# Patient Record
Sex: Female | Born: 1965
Health system: Southern US, Community
[De-identification: ages and names within clinical notes are randomized; demographics above are authoritative.]

## PROBLEM LIST (undated history)

## (undated) DIAGNOSIS — C50919 Malignant neoplasm of unspecified site of unspecified female breast: Secondary | ICD-10-CM

## (undated) DIAGNOSIS — Z9221 Personal history of antineoplastic chemotherapy: Secondary | ICD-10-CM

## (undated) DIAGNOSIS — N12 Tubulo-interstitial nephritis, not specified as acute or chronic: Secondary | ICD-10-CM

## (undated) DIAGNOSIS — G43909 Migraine, unspecified, not intractable, without status migrainosus: Secondary | ICD-10-CM

## (undated) DIAGNOSIS — Z923 Personal history of irradiation: Secondary | ICD-10-CM

## (undated) HISTORY — DX: Malignant neoplasm of unspecified site of unspecified female breast: C50.919

## (undated) HISTORY — DX: Tubulo-interstitial nephritis, not specified as acute or chronic: N12

---

## 2002-07-04 ENCOUNTER — Ambulatory Visit (HOSPITAL_COMMUNITY): Admission: RE | Admit: 2002-07-04 | Discharge: 2002-07-04 | Payer: Self-pay | Admitting: Internal Medicine

## 2002-07-04 HISTORY — PX: COLONOSCOPY: SHX174

## 2003-04-12 ENCOUNTER — Ambulatory Visit (HOSPITAL_COMMUNITY): Admission: RE | Admit: 2003-04-12 | Discharge: 2003-04-12 | Payer: Self-pay | Admitting: Family Medicine

## 2003-04-14 ENCOUNTER — Ambulatory Visit (HOSPITAL_COMMUNITY): Admission: RE | Admit: 2003-04-14 | Discharge: 2003-04-14 | Payer: Self-pay | Admitting: Family Medicine

## 2004-06-09 HISTORY — PX: CHOLECYSTECTOMY: SHX55

## 2004-11-08 ENCOUNTER — Ambulatory Visit (HOSPITAL_COMMUNITY): Admission: RE | Admit: 2004-11-08 | Discharge: 2004-11-08 | Payer: Self-pay | Admitting: Family Medicine

## 2004-11-18 ENCOUNTER — Ambulatory Visit (HOSPITAL_COMMUNITY): Admission: RE | Admit: 2004-11-18 | Discharge: 2004-11-18 | Payer: Self-pay | Admitting: Family Medicine

## 2004-11-29 ENCOUNTER — Ambulatory Visit (HOSPITAL_COMMUNITY): Admission: RE | Admit: 2004-11-29 | Discharge: 2004-11-29 | Payer: Self-pay | Admitting: General Surgery

## 2004-11-29 ENCOUNTER — Encounter (INDEPENDENT_AMBULATORY_CARE_PROVIDER_SITE_OTHER): Payer: Self-pay | Admitting: General Surgery

## 2005-03-27 ENCOUNTER — Ambulatory Visit: Payer: Self-pay | Admitting: Orthopedic Surgery

## 2005-05-08 ENCOUNTER — Ambulatory Visit: Payer: Self-pay | Admitting: Orthopedic Surgery

## 2005-05-13 ENCOUNTER — Encounter (HOSPITAL_COMMUNITY): Admission: RE | Admit: 2005-05-13 | Discharge: 2005-05-29 | Payer: Self-pay | Admitting: Orthopedic Surgery

## 2005-07-02 ENCOUNTER — Ambulatory Visit: Payer: Self-pay | Admitting: Orthopedic Surgery

## 2006-06-09 DIAGNOSIS — Z923 Personal history of irradiation: Secondary | ICD-10-CM

## 2006-06-09 DIAGNOSIS — Z9221 Personal history of antineoplastic chemotherapy: Secondary | ICD-10-CM

## 2006-06-09 DIAGNOSIS — C50919 Malignant neoplasm of unspecified site of unspecified female breast: Secondary | ICD-10-CM

## 2006-06-09 HISTORY — PX: BREAST SURGERY: SHX581

## 2006-06-09 HISTORY — PX: BREAST LUMPECTOMY: SHX2

## 2006-06-09 HISTORY — DX: Personal history of antineoplastic chemotherapy: Z92.21

## 2006-06-09 HISTORY — DX: Personal history of irradiation: Z92.3

## 2006-06-09 HISTORY — DX: Malignant neoplasm of unspecified site of unspecified female breast: C50.919

## 2007-03-04 ENCOUNTER — Encounter: Admission: RE | Admit: 2007-03-04 | Discharge: 2007-03-04 | Payer: Self-pay | Admitting: Family Medicine

## 2007-03-10 ENCOUNTER — Encounter: Admission: RE | Admit: 2007-03-10 | Discharge: 2007-03-10 | Payer: Self-pay | Admitting: Family Medicine

## 2007-03-15 ENCOUNTER — Encounter: Admission: RE | Admit: 2007-03-15 | Discharge: 2007-03-15 | Payer: Self-pay | Admitting: Family Medicine

## 2007-03-15 ENCOUNTER — Encounter (INDEPENDENT_AMBULATORY_CARE_PROVIDER_SITE_OTHER): Payer: Self-pay | Admitting: Diagnostic Radiology

## 2007-03-21 ENCOUNTER — Encounter: Admission: RE | Admit: 2007-03-21 | Discharge: 2007-03-21 | Payer: Self-pay | Admitting: Family Medicine

## 2007-03-25 ENCOUNTER — Ambulatory Visit: Payer: Self-pay | Admitting: Oncology

## 2007-04-01 ENCOUNTER — Encounter: Admission: RE | Admit: 2007-04-01 | Discharge: 2007-04-01 | Payer: Self-pay | Admitting: Surgery

## 2007-04-01 ENCOUNTER — Encounter (INDEPENDENT_AMBULATORY_CARE_PROVIDER_SITE_OTHER): Payer: Self-pay | Admitting: Diagnostic Radiology

## 2007-05-10 ENCOUNTER — Encounter: Admission: RE | Admit: 2007-05-10 | Discharge: 2007-05-10 | Payer: Self-pay | Admitting: Surgery

## 2007-05-14 ENCOUNTER — Encounter: Admission: RE | Admit: 2007-05-14 | Discharge: 2007-05-14 | Payer: Self-pay | Admitting: Surgery

## 2007-05-14 ENCOUNTER — Encounter (INDEPENDENT_AMBULATORY_CARE_PROVIDER_SITE_OTHER): Payer: Self-pay | Admitting: Surgery

## 2007-05-14 ENCOUNTER — Ambulatory Visit (HOSPITAL_BASED_OUTPATIENT_CLINIC_OR_DEPARTMENT_OTHER): Admission: RE | Admit: 2007-05-14 | Discharge: 2007-05-14 | Payer: Self-pay | Admitting: Surgery

## 2007-06-10 HISTORY — PX: OTHER SURGICAL HISTORY: SHX169

## 2007-07-07 ENCOUNTER — Ambulatory Visit (HOSPITAL_BASED_OUTPATIENT_CLINIC_OR_DEPARTMENT_OTHER): Admission: RE | Admit: 2007-07-07 | Discharge: 2007-07-07 | Payer: Self-pay | Admitting: Surgery

## 2007-07-07 ENCOUNTER — Encounter (INDEPENDENT_AMBULATORY_CARE_PROVIDER_SITE_OTHER): Payer: Self-pay | Admitting: Surgery

## 2007-12-24 ENCOUNTER — Ambulatory Visit: Admission: RE | Admit: 2007-12-24 | Discharge: 2008-02-24 | Payer: Self-pay | Admitting: Radiation Oncology

## 2008-02-15 ENCOUNTER — Ambulatory Visit: Payer: Self-pay | Admitting: Cardiology

## 2008-04-09 HISTORY — PX: BILATERAL SALPINGOOPHORECTOMY: SHX1223

## 2008-05-31 ENCOUNTER — Encounter: Admission: RE | Admit: 2008-05-31 | Discharge: 2008-05-31 | Payer: Self-pay | Admitting: Internal Medicine

## 2008-10-19 ENCOUNTER — Ambulatory Visit: Payer: Self-pay | Admitting: Cardiology

## 2008-11-29 ENCOUNTER — Ambulatory Visit (HOSPITAL_COMMUNITY): Admission: RE | Admit: 2008-11-29 | Discharge: 2008-11-29 | Payer: Self-pay | Admitting: Family Medicine

## 2009-03-09 ENCOUNTER — Encounter: Admission: RE | Admit: 2009-03-09 | Discharge: 2009-03-09 | Payer: Self-pay | Admitting: Internal Medicine

## 2009-03-20 ENCOUNTER — Ambulatory Visit: Payer: Self-pay | Admitting: Cardiology

## 2009-04-19 ENCOUNTER — Ambulatory Visit (HOSPITAL_BASED_OUTPATIENT_CLINIC_OR_DEPARTMENT_OTHER): Admission: RE | Admit: 2009-04-19 | Discharge: 2009-04-19 | Payer: Self-pay | Admitting: Surgery

## 2009-06-07 ENCOUNTER — Emergency Department (HOSPITAL_COMMUNITY): Admission: EM | Admit: 2009-06-07 | Discharge: 2009-06-07 | Payer: Self-pay | Admitting: Emergency Medicine

## 2009-09-26 ENCOUNTER — Ambulatory Visit: Payer: Self-pay | Admitting: Cardiology

## 2010-03-11 ENCOUNTER — Encounter: Admission: RE | Admit: 2010-03-11 | Discharge: 2010-03-11 | Payer: Self-pay | Admitting: Internal Medicine

## 2010-04-25 ENCOUNTER — Ambulatory Visit: Payer: Self-pay | Admitting: Cardiology

## 2010-06-30 ENCOUNTER — Encounter: Payer: Self-pay | Admitting: Family Medicine

## 2010-09-09 LAB — URINALYSIS, ROUTINE W REFLEX MICROSCOPIC
Bilirubin Urine: NEGATIVE
Glucose, UA: NEGATIVE mg/dL
Hgb urine dipstick: NEGATIVE
Ketones, ur: NEGATIVE mg/dL
Nitrite: NEGATIVE
pH: 8.5 — ABNORMAL HIGH (ref 5.0–8.0)

## 2010-10-22 NOTE — Op Note (Signed)
NAME:  Melissa Nguyen, Melissa Nguyen              ACCOUNT NO.:  [id number]   MEDICAL RECORD NO.:  [id number]          PATIENT TYPE:  AMB   LOCATION:  DSC                          FACILITY:  [facility]   PHYSICIAN:  Currie Paris, M.D.DATE OF BIRTH:  1965-07-24   DATE OF PROCEDURE:  05/14/2007  DATE OF DISCHARGE:                               OPERATIVE REPORT   OFFICE MEDICAL RECORD NUMBER [phone]   PREOPERATIVE DIAGNOSIS:  Ductal carcinoma in situ, right breast, upper  inner quadrant.   POSTOPERATIVE DIAGNOSIS:  Ductal carcinoma in situ, right breast, upper  inner quadrant.   OPERATION:  Needle localized right partial mastectomy.   SURGEON:  Currie Paris, M.D.   ANESTHESIA:  General.   CLINICAL HISTORY:  This is a 45 year old lady with an area of  calcifications that showed DCIS medially located in the right breast.  After discussion of alternatives, she elected to proceed to a needle  guided wide local excision.   DESCRIPTION OF PROCEDURE:  The patient was seen in the holding area and  she had no further questions.  We confirmed the procedure and plans and  I initialed the right breast as the operative side, as did the patient.   The patient was taken to the operating room and after satisfactory  general anesthesia had been obtained,  the right breast was prepped and  draped.  The time-out was performed.   The 2 guidewires entered medially, one about 2 cm above the other, and  they both tracked towards the nipple-areolar complex.  I outlined and  made an elliptical incision to take some overlying skin over the tract  of the guidewires.  I elevated the skin flap and went superiorly and  then down towards the chest wall getting almost to muscle, first  superiorly, then medially, so I had both the guidewires manipulated into  the wound, then inferiorly, and then came under this tissue, and on  elevating it was able then to divide its final attachments around the  nipple-areolar  complex, actually taking tissue from underneath the  nipple as the very lateral aspect of the lumpectomy.   I sent this for specimen mammography and then spent some time making  sure everything was dry.  I put some clips in to mark for postop  radiation.  I began to close using some 3-0 Vicryl followed by closing  some of the very deep layer, then some superficial layer.  About this  time radiology reported that some of the calcifications appeared very  close to the deep margin, so I thought that there was a little bit more  tissue I could take deep.  So, I went back, opened the incision, and  excised the entire deep margin starting medially and taking about a  third of the superior and inferior margins, that being the deeper half  of those, along with a very deep margin going the entire length of the  incision.  This was all done with cautery and I took fascia off of the  muscle, including it to make sure I had complete excision deep.  I spent several minutes again making sure everything was dry.  I put  Marcaine in to help with postop analgesia.  I put some more clips to  help mark for radiation therapy and then closed again in layers, leaving  something of a  central cavity, but getting the very deep and more superficial layers  closed.  Skin was closed with 4-0 Monocryl subcuticular and Dermabond.   The patient tolerated the procedure well and there were no  complications.  All counts were correct.      Currie Paris, M.D.  Electronically Signed     CJS/MEDQ  D:  05/14/2007  T:  05/14/2007  Job:  [id number]   cc:   Kirk Ruths, M.D.

## 2010-10-22 NOTE — Op Note (Signed)
NAME:  Melissa Nguyen, Melissa Nguyen              ACCOUNT NO.:  [id number]   MEDICAL RECORD NO.:  [id number]          PATIENT TYPE:  AMB   LOCATION:  DSC                          FACILITY:  [facility]   PHYSICIAN:  Currie Paris, M.D.DATE OF BIRTH:  06-01-1966   DATE OF PROCEDURE:  07/07/2007  DATE OF DISCHARGE:                               OPERATIVE REPORT   PREOPERATIVE DIAGNOSIS:  Carcinoma, right breast, clinical stage I.   POSTOPERATIVE DIAGNOSIS:  Carcinoma, right breast, clinical stage I.   OPERATION:  Blue dye injection and right axillary sentinel lymph node  evaluation; Port-A-Cath placement.   SURGEON:  Currie Paris, M.D.   ANESTHESIA:  General.   CLINICAL HISTORY:  Melissa Nguyen is a 45 year old lady who was recently  diagnosed with a small invasive right breast cancer.  It was clear that  she was going to need chemotherapy after her evaluation by her  oncologist.  However, he wished for better staging, a lymph node  evaluation to be done, so she is scheduled for a sentinel node biopsy.  In addition, she needed a port for her chemo so we planned to put the  port in under the same anesthesia.   DESCRIPTION OF PROCEDURE:  The patient was seen in the holding area and  she had no further questions regarding the surgery as noted above.  I  identified and marked the right breast as the side for the axillary  dissection.  The patient was taken to the operating room and after  satisfactory general anesthesia had been obtained, the upper chest and  lower neck were prepped and draped as a sterile field.  She was in  Trendelenburg position.  Her arms were tucked. The time out was done  again.   The left subclavian vein was entered on the initial attempt and the  guidewire threaded easily into the superior vena cava.  A transverse  incision was made over the anterior chest wall and a pocket fashioned  with cautery.  The Port-A-Cath tubing was brought from the pocket into  the  guidewire site.  The guidewire tract was dilated once and the  dilator and guidewire were removed.  The catheter was threaded to  approximately 25 cm.  The peel away sheath was removed.  The catheter  flushed and aspirated easily.   Using fluoro, I could see we appeared to be in the right ventricle.  The  catheter was backed up to approximately 17 cm where it appeared to be in  the distal superior vena cava.  Again, it aspirated and flushed easily.  The reservoir was flushed, attached, and the locking mechanism engaged.  This aspirated and flushed easily.  It was then secured to the fascia  with sutures of 2-0 Prolene.  A final check was made by fluoro and we  seemed to have no kinks with good positioning of the tip of the catheter  and no evidence of pneumothorax.  The incision was closed with 3-0  Vicryl, 4-0 Monocryl subcuticular, and Dermabond.   The patient was then repositioned with her right arm to her side.  The  right axillary area was prepped and draped as a sterile field.  At the  beginning of the case, I had injected her subareolar area with blue dye  having already done the time out at that point.  I then used the  Neoprobe to identify a hot area in the axilla, made a transverse  incision, and almost immediately found about a 1.5 cm soft node that had  counts in situ of about 500 to 800 and some blue dye coming into it.  This was grasped with a Babcock and as I began to dissect it out, I  found a second and then a third slightly deeper node, both smaller but  having blue dye in them and counts.  The most deep one, which was the  smallest, was removed first and had counts of about 500.  The middle one  was then removed and again fairly small and had counts of about 500.  The larger node, which was identified first, was removed and had counts  of about 1200.   With these three nodes out, I could palpate no enlarged nodes, see no  other blue dye coming into the axilla, and  all counts were down to 0 to  5 in the axilla.  While waiting for pathology, I went ahead and closed  this incision with 3-0 Vicryl followed by 4-0 Monocryl subcuticular and  some Dermabond.  Dr. Colonel Bald reported that all three nodes were negative.   This completed the case.  The patient was taken to the recovery room for  her postoperative chest x-ray in recovery.  She tolerated all the  procedures well.  All counts were correct.      Currie Paris, M.D.  Electronically Signed     CJS/MEDQ  D:  07/07/2007  T:  07/07/2007  Job:  [id number]   cc:   Kirk Ruths, M.D.  Boris M. Darovsky, M.D.

## 2010-10-25 NOTE — H&P (Signed)
NAME:  Melissa Nguyen, Melissa Nguyen              ACCOUNT NO.:  [id number]   MEDICAL RECORD NO.:  [id number]          PATIENT TYPE:  AMB   LOCATION:                                FACILITY:  [facility]   PHYSICIAN:  Dalia Heading, M.D.  DATE OF BIRTH:  Jul 30, 1965   DATE OF ADMISSION:  11/29/2004  DATE OF DISCHARGE:  LH                                HISTORY & PHYSICAL   CHIEF COMPLAINT:  Biliary colic secondary to chronic cholecystitis.   HISTORY OF PRESENT ILLNESS:  The patient is a 45 year old white female who  was referred for evaluation and treatment of biliary colic secondary to  chronic cholecystitis.  She has been having right upper quadrant abdominal  pain with radiation to the right flank, nausea and bloating for several  weeks.  She does have fatty food intolerance.  No fever, chills or jaundice  had been noted.   PAST MEDICAL HISTORY:  Unremarkable.   PAST SURGICAL HISTORY:  C-section, colonoscopy.   CURRENT MEDICATIONS:  Amitiza 1 pill q.h.s. p.r.n.   ALLERGIES:  No known drug allergies.   REVIEW OF SYSTEMS:  Noncontributory.   PHYSICAL EXAMINATION:  GENERAL APPEARANCE:  The patient is a well-developed,  well-nourished white female in no acute distress.  VITAL SIGNS:  She is afebrile, and the vital signs are stable.  LUNGS:  Clear to auscultation with equal breath sounds bilaterally.  HEART:  Reveals a regular rate and rhythm without S3, S4 or murmurs.  ABDOMEN:  Soft and nondistended.  She is tender in the right upper quadrant  to palpation.  No hepatosplenomegaly, masses or hernias are identified.   An ultrasound of the gallbladder reveals no gallstones.  Hepatobiliary scan  reveals chronic cholecystitis with a low gallbladder ejection fraction and  reproducible symptoms with a fatty meal.   IMPRESSION:  Chronic cholecystitis.   PLAN:  The patient is scheduled for a laparoscopic cholecystectomy on November 29, 2004.  The risks and benefits of the procedure including bleeding,  infection, hepatobiliary injury and the possibility of an open procedure  were fully explained to the patient, who gave informed consent.       MAJ/MEDQ  D:  11/21/2004  T:  11/21/2004  Job:  [id number]   cc:   Dalia Heading, M.D.  [address], [place]  [place]  [place] [address]  Fax: [phone]   Kirk Ruths, M.D.  [address]  [place]  [place] [address]  Fax: [phone]

## 2010-10-25 NOTE — Op Note (Signed)
NAME:  Melissa Nguyen, Melissa Nguyen              ACCOUNT NO.:  [id number]   MEDICAL RECORD NO.:  [id number]          PATIENT TYPE:  AMB   LOCATION:  DAY                           FACILITY:  [facility]   PHYSICIAN:  Dalia Heading, M.D.  DATE OF BIRTH:  03-Dec-1965   DATE OF PROCEDURE:  11/29/2004  DATE OF DISCHARGE:                                 OPERATIVE REPORT   PREOPERATIVE DIAGNOSIS:  Chronic cholecystitis.   POSTOPERATIVE DIAGNOSIS:  Chronic cholecystitis.   PROCEDURE:  Laparoscopic cholecystectomy.   SURGEON:  Dr. Franky Macho.   ASSISTANT:  Dr. Bernerd Limbo. Destefano.   ANESTHESIA:  General endotracheal.   INDICATIONS:  The patient is a 45 year old white female presents with  cholecystitis secondary to chronic  cholecystitis. The risks and benefits of  the procedure including bleeding, infection, hepatobiliary injury, and the  possibility of an open procedure were fully explained to the patient, who  gave informed consent.   PROCEDURE NOTE:  The patient was placed in supine position. After induction  of general endotracheal anesthesia, the abdomen was prepped and draped using  the usual sterile technique with Betadine. Surgical site confirmation was  performed.   A supraumbilical incision was made down to fascia. Veress needle was  introduced into the abdominal cavity, and confirmation of placement was done  using the saline drop test. The abdomen was then insufflated to 16 mmHg  pressure. An 11-mm trocar was introduced into the abdominal cavity under  direct visualization without difficulty. The patient was placed in reversed  Trendelenburg position. An additional 11-mm trocar was placed in the  epigastric region, and 5-mm trocars were placed in the right upper quadrant  and right flank regions. Liver was inspected and noted to normal limits. The  gallbladder was retracted superiorly and laterally. The dissection was begun  around the infundibulum of the gallbladder. The cystic duct was  first  identified. Its juncture to the infundibulum fully identified. Endoclips  were placed proximally and distally on cystic duct, and cystic duct was  divided. This was likewise done to the cystic artery. The gallbladder was  then freed away from the gallbladder fossa using Bovie electrocautery. The  gallbladder was delivered through the epigastric trocar site using an  EndoCatch bag. Gallbladder fossa was inspected. No abnormal bleeding or bile  leakage was noted. Surgicel was placed in the gallbladder fossa. All fluid  and air were then evacuate from the abdominal cavity prior to removal of the  trocars.   All wounds were irrigated with normal saline. All wounds were injected with  0.5% Sensorcaine. The supraumbilical fascia was reapproximated using an 0  Vicryl interrupted suture. All skin incisions were closed using staples.  Betadine ointment and dry sterile dressings were applied.   All tape and needle counts were correct at the end of the procedure. The  patient was extubated in the operating room went back to recovery room awake  in stable condition.   COMPLICATIONS:  None.   SPECIMEN:  Gallbladder.   BLOOD LOSS:  Minimal.       MAJ/MEDQ  D:  11/29/2004  T:  11/29/2004  Job:  [id number]   cc:   Kirk Ruths, M.D.  [address]  [place]  [place] [address]  Fax: [phone]

## 2010-10-25 NOTE — Consult Note (Signed)
NAME:  Melissa Nguyen, Melissa Nguyen                          ACCOUNT NO.:  [id number]   MEDICAL RECORD NO.:  [id number]                  PATIENT TYPE:   LOCATION:                                       FACILITY:   PHYSICIAN:  R. Roetta Sessions, M.D.              DATE OF BIRTH:  February 16, 1966   DATE OF CONSULTATION:  06/22/2002  DATE OF DISCHARGE:                                   CONSULTATION   REASON FOR CONSULTATION:  Rectal bleeding.   HISTORY OF PRESENT ILLNESS:  The patient is a 45 year old Caucasian female  patient of Dr. Regino Schultze who presents today for further evaluation of a two-  week history of hematochezia.  She gives a history of chronic constipation,  generally has one bowel movement every week or even less.  She has taken  stool softeners with laxatives as needed before.  On June 09, 2002 she  developed left lower quadrant abdominal pain.  She had a small bowel  movement and did have some improvement of this pain.  However, subsequent  bowel movements have also had bright red blood mixed with them.  She has  seen blood with most bowel movements but not every time.  Her last bowel  movement was Sunday and she saw blood at that time.  Denies any clots or  melena.  Currently she is going to the bathroom two to three times a day  which is unusual for her.  No frank diarrhea, however.  She describes the  left lower quadrant abdominal pain as a dull ache with episodes of increased  intensity.  She has seen anywhere from a small to moderate amount of bright  red blood per rectum.  Denies any rectal pain, nausea, vomiting, heartburn,  fever, chills, dysuria, hematuria.   She saw Dr. Regino Schultze on June 20, 2002 for these symptoms.  She was started  on hydrocortisone suppositories one b.i.d. and Flagyl 250 mg t.i.d.   CURRENT MEDICATIONS:  1. Flagyl 250 mg t.i.d.  2. Hydrocortisone suppositories one b.i.d.   ALLERGIES:  No known drug allergies.   PAST MEDICAL HISTORY:  History of  recurrent pyelonephritis requiring  hospitalizations previously.   PAST SURGICAL HISTORY:  Cesarean section.   FAMILY HISTORY:  Maternal grandmother had colon cancer diagnosed in her 81s;  she passed away in her 60s with unrelated causes.  No family history of  inflammatory bowel disease.   SOCIAL HISTORY:  She has been married for 20 years.  She has one child.  She  is employed with [employer].  She has never been a smoker.  Denies any alcohol use.   REVIEW OF SYSTEMS:  Please see HPI for GI, for general, for GU.   PHYSICAL EXAMINATION:  VITAL SIGNS:  Weight 175.25 pounds, height 5 feet 7  inches.  Temperature 97.2, blood pressure 130/80, pulse 96.  GENERAL:  Pleasant, well-nourished, well-developed Caucasian female in no  acute distress.  SKIN:  Warm and dry, no jaundice.  HEENT:  Pupils equal, round, and reactive to light.  Conjunctivae are pink,  sclerae nonicteric.  Oropharyngeal mucosa moist and pink.  No lesions,  erythema, or exudate.  No lymphadenopathy, thyromegaly, or carotid bruits.  CHEST:  Lungs are clear to auscultation.  CARDIAC:  Reveals regular rate and rhythm, normal S1, S2.  No murmurs, rubs,  or gallops.  ABDOMEN:  Positive bowel sounds, soft, nondistended.  She has mild  tenderness just right of the umbilicus as well as in the suprapubic region.  Mild left lower quadrant tenderness to deep palpation as well.  No  organomegaly or masses.  EXTREMITIES:  No edema.   IMPRESSION:  The patient is a 45 year old lady who has a two-week history of  hematochezia as well as change in bowel habits from constipation to multiple  stools daily.  She has associated left lower quadrant abdominal pain but on  physical examination also had pain just right of he umbilicus as well.  Etiology of symptoms unclear at this time.  Differential diagnosis includes  infectious colitis, diverticulitis, IBS plus possible hemorrhoid bleed,  inflammatory bowel disease not  ruled out.  Discussed with her the best  approach to diagnose and cover all the essentials would be colonoscopy.  We  discussed risks, alternatives, and benefits.  The patient is agreeable to  proceed.   PLAN:  1. She will complete her course of Flagyl and hydrocortisone suppositories.  2. Colonoscopy in the near future.  3. Further recommendations to follow.   I would like to thank Dr. Regino Schultze for allowing [place] to take part in the care  of this patient.     Tana Coast, Pricilla Larsson, M.D.    LL/MEDQ  D:  06/22/2002  T:  06/22/2002  Job:  [id number]   cc:   Kirk Ruths, M.D.  [address]  [place]  [place] [address]  Fax: [phone]

## 2010-10-25 NOTE — Op Note (Signed)
NAME:  Melissa Nguyen, Melissa Nguyen                        ACCOUNT NO.:  [id number]   MEDICAL RECORD NO.:  [id number]                   PATIENT TYPE:  AMB   LOCATION:  DAY                                  FACILITY:  [facility]   PHYSICIAN:  R. Roetta Sessions, M.D.              DATE OF BIRTH:  10-06-1965   DATE OF PROCEDURE:  07/04/2002  DATE OF DISCHARGE:                                  PROCEDURE NOTE   INDICATIONS FOR PROCEDURE:  The patient is a 45 year old lady admitted with  hematochezia in the setting of diarrhea since the first of January.  She was  given a course of Flagyl and hydrocortisone suppositories.  Both her  diarrhea and rectal bleeding have cleared up.  Colonoscopy is now being done  to further evaluate her rectal bleeding.  This approach has been discussed  with the patient previously.  Potential risks, benefits, and alternatives  have been reviewed and questions answered.  She is agreeable.  Please see my  June 22, 2002 consultation note for more information.   PROCEDURE:  O2 saturation, blood pressure, pulse, and respirations were  monitored throughout the entire procedure.   ANESTHESIA:  Conscious sedation with Versed 4 mg IV, Demerol 75 mg IV in  divided doses.   INSTRUMENT:  Olympus videochip adult colonoscope.   FINDINGS:  Digital rectal exam revealed small external hemorrhoid tag.  Digital examination otherwise was normal.   Endoscopic prep was good.   Rectal and colon:  Examination of the rectal mucosa including retroflexed  views in the anal verge revealed minimal internal hemorrhoids.   Colon:  The colonic mucosa was surveyed from the rectosigmoid junction  through the left transverse and right colon to the appendicle orifice,  ileocecal valve, and cecum.  All of these structures were well seen and  photographed for the record.  The other abnormality was a 5-mm polyp at 30  cm from the level of the cecum to the cecal valve.  The scope was slowly and  cautiously  withdrawn, and all previously mentioned surfaces  were again  seen.  No other abnormalities were observed.  The polyp at 30 cm was cold  biopsied/removed.  The patient tolerated the procedure well and was reactive  at end of the procedure.   IMPRESSION:  1. Small internal and external hemorrhoids, otherwise normal rectum.  2. Small polyp at 30 cm cold biopsied.  The remainder of colonic mucosa     appeared normal.  3. Since the patient's recent gastrointestinal symptoms have resolved, no     further evaluation warranted.  Suspect she had a food-borne illness     producing diarrhea with secondary hemorrhoidal inflammation.    RECOMMENDATIONS:  1. We will follow up on pathology.  2. Hemorrhoid literature provided to the patient.  3. Further recommendations to follow.  Jonathon Bellows, M.D.    RMR/MEDQ  D:  07/04/2002  T:  07/04/2002  Job:  [id number]   cc:   Kirk Ruths, M.D.  [address]  [place]  [place] [address]  Fax: [phone]

## 2011-02-03 ENCOUNTER — Other Ambulatory Visit (HOSPITAL_COMMUNITY): Payer: Self-pay | Admitting: Internal Medicine

## 2011-02-03 DIAGNOSIS — Z9889 Other specified postprocedural states: Secondary | ICD-10-CM

## 2011-03-13 ENCOUNTER — Ambulatory Visit
Admission: RE | Admit: 2011-03-13 | Discharge: 2011-03-13 | Disposition: A | Discharge status: Home / Self Care | Payer: BC Managed Care – PPO | Source: Ambulatory Visit | Attending: Internal Medicine | Admitting: Internal Medicine

## 2011-03-13 DIAGNOSIS — Z9889 Other specified postprocedural states: Secondary | ICD-10-CM

## 2011-03-17 LAB — COMPREHENSIVE METABOLIC PANEL
AST: 20
Alkaline Phosphatase: 57
BUN: 8
CO2: 28
Chloride: 101
Creatinine, Ser: 0.71
GFR calc non Af Amer: >60
Potassium: 4.2
Total Bilirubin: 0.6

## 2011-03-17 LAB — DIFFERENTIAL
Basophils Absolute: 0
Eosinophils Absolute: 0.1 — ABNORMAL LOW
Lymphocytes Relative: 27
Lymphs Abs: 2.1
Monocytes Relative: 4
Neutro Abs: 5.1

## 2011-03-17 LAB — CBC
HCT: 35.4 — ABNORMAL LOW
Hemoglobin: 11.8 — ABNORMAL LOW
MCV: 83.5
RBC: 4.24
WBC: 7.6

## 2011-03-17 LAB — URINALYSIS, ROUTINE W REFLEX MICROSCOPIC
Glucose, UA: NEGATIVE
Hgb urine dipstick: NEGATIVE
Specific Gravity, Urine: 1.015
pH: 6.5

## 2011-04-01 ENCOUNTER — Emergency Department (HOSPITAL_COMMUNITY)
Admission: EM | Admit: 2011-04-01 | Discharge: 2011-04-01 | Disposition: A | Discharge status: Home / Self Care | Payer: BC Managed Care – PPO | Attending: Emergency Medicine | Admitting: Emergency Medicine

## 2011-04-01 ENCOUNTER — Emergency Department (HOSPITAL_COMMUNITY): Payer: BC Managed Care – PPO

## 2011-04-01 ENCOUNTER — Other Ambulatory Visit: Payer: Self-pay

## 2011-04-01 DIAGNOSIS — M62838 Other muscle spasm: Secondary | ICD-10-CM | POA: Insufficient documentation

## 2011-04-01 DIAGNOSIS — R0789 Other chest pain: Secondary | ICD-10-CM

## 2011-04-01 DIAGNOSIS — J984 Other disorders of lung: Secondary | ICD-10-CM | POA: Insufficient documentation

## 2011-04-01 DIAGNOSIS — Z853 Personal history of malignant neoplasm of breast: Secondary | ICD-10-CM | POA: Insufficient documentation

## 2011-04-01 DIAGNOSIS — R51 Headache: Secondary | ICD-10-CM

## 2011-04-01 DIAGNOSIS — R071 Chest pain on breathing: Secondary | ICD-10-CM | POA: Insufficient documentation

## 2011-04-01 HISTORY — DX: Migraine, unspecified, not intractable, without status migrainosus: G43.909

## 2011-04-01 LAB — CBC
MCH: 28.3 pg (ref 26.0–34.0)
MCHC: 33 g/dL (ref 30.0–36.0)
MCV: 85.8 fL (ref 78.0–100.0)
Platelets: 254 10*3/uL (ref 150–400)
RBC: 4.66 MIL/uL (ref 3.87–5.11)
RDW: 13.3 % (ref 11.5–15.5)

## 2011-04-01 LAB — DIFFERENTIAL
Basophils Absolute: 0 10*3/uL (ref 0.0–0.1)
Basophils Relative: 0 % (ref 0–1)
Eosinophils Absolute: 0.2 10*3/uL (ref 0.0–0.7)
Eosinophils Relative: 2 % (ref 0–5)
Neutrophils Relative %: 65 % (ref 43–77)

## 2011-04-01 LAB — BASIC METABOLIC PANEL
CO2: 30 mEq/L (ref 19–32)
Calcium: 10.1 mg/dL (ref 8.4–10.5)
Creatinine, Ser: 0.88 mg/dL (ref 0.50–1.10)
GFR calc non Af Amer: 79 mL/min — ABNORMAL LOW (ref >90)
Glucose, Bld: 95 mg/dL (ref 70–99)
Sodium: 141 mEq/L (ref 135–145)

## 2011-04-01 LAB — D-DIMER, QUANTITATIVE: D-Dimer, Quant: 0.43 ug/mL-FEU (ref 0.00–0.48)

## 2011-04-01 MED ORDER — GADOBENATE DIMEGLUMINE 529 MG/ML IV SOLN
19.0000 mL | Freq: Once | INTRAVENOUS | Status: AC | PRN
  Administered 2011-04-01: 19 mL via INTRAVENOUS

## 2011-04-01 MED ORDER — LORAZEPAM 2 MG/ML IJ SOLN
0.5000 mg | Freq: Once | INTRAMUSCULAR | Status: AC
  Administered 2011-04-01: 0.5 mg via INTRAVENOUS
  Filled 2011-04-01: qty 1

## 2011-04-01 MED ORDER — IOHEXOL 300 MG/ML  SOLN
80.0000 mL | Freq: Once | INTRAMUSCULAR | Status: AC | PRN
  Administered 2011-04-01: 80 mL via INTRAVENOUS

## 2011-04-01 NOTE — ED Provider Notes (Signed)
History     CSN: [id number] Arrival date & time: 04/01/2011  3:18 PM     Chief Complaint  Patient presents with  . Chest Pain     HPI Pt was seen at 1610.  Per pt, c/o gradual onset and persistence of acute flair of her chronic left sided "headache" that began this morning after she woke up.  Describes the headache as per her usual chronic headache pain pattern "for years."  Denies headache was sudden or maximal in onset or at any time.  Denies visual changes, no focal motor weakness, no tingling/numbness in extremities, no fevers, no neck pain, no rash.  States after she took some OTC meds for her headache, she felt her left trapezius muscle become "sore."  States since last night she has had constant "sore" left sided upper chest wall "pain."  Left sided chest and neck pain worsen with palpation of the area and movement.  Hx breast CA in remission, LD chemo 2010.  Denies palpitations, no SOB/cough, no abd pain, no N/V/D, no injury.      Past Medical History  Diagnosis Date  . Cancer     breast cancer  . Migraine headache     Past Surgical History  Procedure Date  . Cholecystectomy   . Oophorectomy   . Breast surgery     History  Substance Use Topics  . Smoking status: Never Smoker   . Smokeless tobacco: Not on file  . Alcohol Use: No    Review of Systems ROS: Statement: All systems negative except as marked or noted in the HPI; Constitutional: Negative for fever and chills. ; ; Eyes: Negative for eye pain, redness and discharge. ; ; ENMT: Negative for ear pain, hoarseness, nasal congestion, sinus pressure and sore throat. ; ; Cardiovascular: +chest pain.  Negative for palpitations, diaphoresis, dyspnea and peripheral edema. ; ; Respiratory: Negative for cough, wheezing and stridor. ; ; Gastrointestinal: Negative for nausea, vomiting, diarrhea and abdominal pain, blood in stool, hematemesis, jaundice and rectal bleeding. . ; ; Genitourinary: Negative for dysuria, flank pain  and hematuria. ; ; Musculoskeletal: Positive for upper back and neck pain.  Negative for swelling and trauma.; ; Skin: Negative for pruritus, rash, abrasions, blisters, bruising and skin lesion.; ; Neuro: +headache.  Negative for lightheadedness and neck stiffness. Negative for weakness, altered level of consciousness , altered mental status, extremity weakness, paresthesias, involuntary movement, seizure and syncope.     Allergies  Hydrocodone  Home Medications  No current outpatient prescriptions on file.  BP 128/75  Pulse 94  Temp(Src) 98.1 F (36.7 C) (Oral)  Resp 24  Ht 5\' 6"  (1.676 m)  Wt 203 lb (92.08 kg)  BMI 32.76 kg/m2  SpO2 100%  Physical Exam 1615: Physical examination:  Nursing notes reviewed; Vital signs and O2 SAT reviewed;  Constitutional: Well developed, Well nourished, Well hydrated, In no acute distress; Head:  Normocephalic, atraumatic; Eyes: EOMI, PERRL, No scleral icterus; ENMT: Mouth and pharynx normal, Mucous membranes moist; Neck: Supple, Full range of motion, No lymphadenopathy, no meningeal signs; Cardiovascular: Regular rate and rhythm, No murmur, rub, or gallop; Respiratory: Breath sounds clear & equal bilaterally, No rales, rhonchi, wheezes, or rub, Normal respiratory effort/excursion; Chest: +TTP left upper chest wall, Movement normal, no rash; Abdomen: Soft, Nontender, Nondistended, Normal bowel sounds; Spine:  No midline CS, TS, LS tenderness.  +TTP left trapezius muscle and left cervical/thoracic paraspinal muscles.  Extremities: Pulses normal, No tenderness, No edema, No calf edema or asymmetry.; Neuro:  AA&Ox3, No facial droop, speech clear.  Major CN grossly intact.  No gross focal motor or sensory deficits in extremities.; Skin: Color normal, Warm, Dry, no rash, no petechiae.    ED Course  Procedures  1615:  Pt states she doesn't want any pain meds.  States the "pains aren't that bad."   MDM  MDM Reviewed: nursing note and vitals Interpretation:  labs, ECG, x-ray and CT scan    Date: 04/01/2011  Rate: 87  Rhythm: normal sinus rhythm  QRS Axis: normal  Intervals: normal  ST/T Wave abnormalities: normal  Conduction Disutrbances:none  Narrative Interpretation:   Old EKG Reviewed: none available  Results for orders placed during the hospital encounter of 04/01/11  POCT I-STAT TROPONIN I      Component Value Range   Troponin i, poc 0.00  0.00 - 0.08 (ng/mL)   Comment 3           BASIC METABOLIC PANEL      Component Value Range   Sodium 141  135 - 145 (mEq/L)   Potassium 3.4 (*) 3.5 - 5.1 (mEq/L)   Chloride 100  96 - 112 (mEq/L)   CO2 30  19 - 32 (mEq/L)   Glucose, Bld 95  70 - 99 (mg/dL)   BUN 10  6 - 23 (mg/dL)   Creatinine, Ser 4.09  0.50 - 1.10 (mg/dL)   Calcium 81.1  8.4 - 10.5 (mg/dL)   GFR calc non Af Amer 79 (*) >90 (mL/min)   GFR calc Af Amer >90  >90 (mL/min)  CBC      Component Value Range   WBC 11.1 (*) 4.0 - 10.5 (K/uL)   RBC 4.66  3.87 - 5.11 (MIL/uL)   Hemoglobin 13.2  12.0 - 15.0 (g/dL)   HCT 91.4  78.2 - 95.6 (%)   MCV 85.8  78.0 - 100.0 (fL)   MCH 28.3  26.0 - 34.0 (pg)   MCHC 33.0  30.0 - 36.0 (g/dL)   RDW 21.3  08.6 - 57.8 (%)   Platelets 254  150 - 400 (K/uL)  DIFFERENTIAL      Component Value Range   Neutrophils Relative 65  43 - 77 (%)   Neutro Abs 7.2  1.7 - 7.7 (K/uL)   Lymphocytes Relative 27  12 - 46 (%)   Lymphs Abs 3.0  0.7 - 4.0 (K/uL)   Monocytes Relative 6  3 - 12 (%)   Monocytes Absolute 0.6  0.1 - 1.0 (K/uL)   Eosinophils Relative 2  0 - 5 (%)   Eosinophils Absolute 0.2  0.0 - 0.7 (K/uL)   Basophils Relative 0  0 - 1 (%)   Basophils Absolute 0.0  0.0 - 0.1 (K/uL)  D-DIMER, QUANTITATIVE      Component Value Range   D-Dimer, Quant 0.43  0.00 - 0.48 (ug/mL-FEU)   Dg Chest 2 View  04/01/2011  *RADIOLOGY REPORT*  Clinical Data: Migraine headache.  Radiation into the chest and upper back.  History of right-sided breast cancer.  CHEST - 2 VIEW  Comparison: 07/07/2007.   05/10/2007.  Findings: Cholecystectomy clips are present in the right upper quadrant.  Cardiopericardial silhouette appears within normal limits.  Left basilar scarring or atelectasis adjacent to the cardiac apex.  There is ill-defined right suprahilar opacity and some prominence of the right perihilar soft tissues.  Follow-up chest CT, preferably with infusion is recommended.  Mild bilateral pleural apical scarring is present.  Mild thoracic spondylosis. Chronic wedge deformity of mid  thoracic vertebral body noted.  IMPRESSION:  Right suprahilar opacity extending into the right paratracheal soft tissues.  Although this could represent pneumonia, in a patient with history malignancy, pulmonary neoplasm or metastatic disease is not excluded.  Follow-up chest CT preferably with infusion recommended.  Original Report Authenticated By: Andreas Newport, M.D.   Ct Head Wo Contrast  04/01/2011  *RADIOLOGY REPORT*  Clinical Data: Migraine headache, nausea, now with neck, chest and left shoulder pain, history breast cancer  CT HEAD WITHOUT CONTRAST  Technique:  Contiguous axial images were obtained from the base of the skull through the vertex without contrast.  Comparison: None  Findings: Normal ventricular morphology. No midline shift or mass effect. Small focus of nonspecific white matter hypoattenuation posterior right centrum semiovale. No intracranial hemorrhage, mass lesion or evidence of acute infarction. No extra-axial fluid collections. Visualized paranasal sinuses and mastoid air cells clear. Bones unremarkable.  IMPRESSION: Small focus of nonspecific white matter hypoattenuation at right centrum semiovale, could represent small vessel chronic ischemic change or small focus of prior white matter infarction. However due to history of breast cancer, would recommend follow-up MR imaging of the brain with and without contrast to definitively exclude other etiologies including tumor, though this is considered less  likely. No other intracranial abnormalities identified.  Original Report Authenticated By: Lollie Marrow, M.D.    Mr Laqueta Jean WU Contrast  04/01/2011  *RADIOLOGY REPORT*  Clinical Data: Migraine headache.  History breast cancer.  Abnormal CT scan.  MRI HEAD WITHOUT AND WITH CONTRAST  Technique:  Multiplanar, multiecho pulse sequences of the brain and surrounding structures were obtained according to standard protocol without and with intravenous contrast  Contrast: 19mL MULTIHANCE GADOBENATE DIMEGLUMINE 529 MG/ML IV SOLN  Comparison: CT head without contrast 04/01/2011.  Findings: There is a focal white matter lesion to account for the CT finding in the posterior right central some of palate.  There is no enhancement associated with this lesion.  No other significant white matter disease is evident.  No acute infarct, hemorrhage, or mass lesion is present.  Flow is present within the major intracranial arteries.  The globes and orbits are intact.  The paranasal sinuses and mastoid air cells are clear.  IMPRESSION:  1.  No new local right posterior parietal white matter lesion without evidence of enhancement to suggest metastatic disease. This likely represents a remote ischemic lesion or less likely a demyelinating lesion. 2.  Otherwise normal appearance of the brain.  Original Report Authenticated By: Jamesetta Orleans. MATTERN, M.D.   Ct Chest W Contrast  04/01/2011  *RADIOLOGY REPORT*  Clinical Data: Abnormal chest radiograph.  Question right paratracheal/suprahilar opacity.  History of breast cancer.  Prior mastectomy and chemotherapy/radiation therapy.  CT CHEST WITH CONTRAST  Technique:  Multidetector CT imaging of the chest was performed following the standard protocol during bolus administration of intravenous contrast.  Contrast: 80mL OMNIPAQUE IOHEXOL 300 MG/ML IV SOLN  Comparison: Plain film of earlier today.  No prior CT.  Findings: Lung windows demonstrate minimal subpleural nodularity in the right  lower lobe, likely scarring.  Image 37. 2 mm right lower lobe lung nodule on image 31. 2 mm left lower lobe subpleural nodule on image 33, favored to represent a subpleural lymph node. No pulmonary parenchymal correlate for the questioned plain film abnormality.  Soft tissue windows demonstrate surgical clips within the right breast.  No axillary adenopathy. Normal heart size without pericardial or pleural effusion.  No mediastinal or hilar adenopathy.  No adenopathy  correspond to the questioned plain film abnormality.  Probable residual thymus in the anterior mediastinum. No internal mammary adenopathy.  Limited abdominal imaging demonstrates scattered well-circumscribed low density liver lesions, likely cysts.  These were present on 11/29/2008.  Too small to characterize upper pole right renal lesion was also similar back in 2010. Cholecystectomy without biliary ductal dilatation.  No acute osseous abnormality.  IMPRESSION: 1.  No CT correlate for the questioned plain film abnormality. 2.  No evidence of metastatic disease. 3.  Scattered lung nodules, favored to represent subpleural lymph nodes.  CT follow-up at approximately 6 months could be performed, if there is a clinical concern of pulmonary metastasis (felt unlikely).  Original Report Authenticated By: Consuello Bossier, M.D.    10:15 PM:  CP has been constant since last night with normal troponin and EKG.  Doubt ACS as cause for CP.  abd soft/NT, doubt referred pain from abd/spleen.  Pt informed of above.  States she feels better and wants to go home now.  Dx testing d/w pt and family.  Questions answered.  Verb understanding, agreeable to d/c home with outpt f/u.   Jachob Mcclean Allison Quarry, DO 04/03/11 2118

## 2011-04-01 NOTE — ED Notes (Signed)
Notified Dr. Clarene Duke of pt, ecg shown to edp.  VSS.

## 2011-04-01 NOTE — ED Notes (Signed)
Pt woke up with migraine headache.  Says took some otc medication for pain.  Pt says pain moved from head to neck and now is having pain in chest and left shoulder.  REports is a little nauseated.  Pt says pain is worse with movement.

## 2011-04-01 NOTE — ED Notes (Signed)
Patient given water. Resting comfortably. 

## 2012-02-03 ENCOUNTER — Other Ambulatory Visit (HOSPITAL_COMMUNITY): Payer: Self-pay | Admitting: Internal Medicine

## 2012-02-03 DIAGNOSIS — Z9889 Other specified postprocedural states: Secondary | ICD-10-CM

## 2012-02-03 DIAGNOSIS — Z853 Personal history of malignant neoplasm of breast: Secondary | ICD-10-CM

## 2012-03-16 ENCOUNTER — Ambulatory Visit
Admission: RE | Admit: 2012-03-16 | Discharge: 2012-03-16 | Disposition: A | Discharge status: Home / Self Care | Payer: BC Managed Care – PPO | Source: Ambulatory Visit | Attending: Internal Medicine | Admitting: Internal Medicine

## 2012-03-16 DIAGNOSIS — Z9889 Other specified postprocedural states: Secondary | ICD-10-CM

## 2012-03-16 DIAGNOSIS — Z853 Personal history of malignant neoplasm of breast: Secondary | ICD-10-CM

## 2012-03-24 ENCOUNTER — Encounter (INDEPENDENT_AMBULATORY_CARE_PROVIDER_SITE_OTHER): Payer: BC Managed Care – PPO

## 2012-03-24 DIAGNOSIS — C50919 Malignant neoplasm of unspecified site of unspecified female breast: Secondary | ICD-10-CM

## 2012-07-01 ENCOUNTER — Encounter (INDEPENDENT_AMBULATORY_CARE_PROVIDER_SITE_OTHER): Payer: Self-pay

## 2013-01-19 ENCOUNTER — Other Ambulatory Visit: Payer: Self-pay | Admitting: Family Medicine

## 2013-01-19 DIAGNOSIS — Z9889 Other specified postprocedural states: Secondary | ICD-10-CM

## 2013-01-19 DIAGNOSIS — Z853 Personal history of malignant neoplasm of breast: Secondary | ICD-10-CM

## 2013-03-18 ENCOUNTER — Ambulatory Visit
Admission: RE | Admit: 2013-03-18 | Discharge: 2013-03-18 | Disposition: A | Discharge status: Home / Self Care | Payer: BC Managed Care – PPO | Source: Ambulatory Visit | Attending: Family Medicine | Admitting: Family Medicine

## 2013-03-18 DIAGNOSIS — Z853 Personal history of malignant neoplasm of breast: Secondary | ICD-10-CM

## 2013-03-18 DIAGNOSIS — Z9889 Other specified postprocedural states: Secondary | ICD-10-CM

## 2013-04-25 ENCOUNTER — Ambulatory Visit (INDEPENDENT_AMBULATORY_CARE_PROVIDER_SITE_OTHER): Payer: BC Managed Care – PPO | Admitting: Gastroenterology

## 2013-04-25 ENCOUNTER — Encounter: Payer: Self-pay | Admitting: Gastroenterology

## 2013-04-25 ENCOUNTER — Encounter (INDEPENDENT_AMBULATORY_CARE_PROVIDER_SITE_OTHER): Payer: Self-pay

## 2013-04-25 VITALS — BP 131/82 | HR 92 | Temp 97.4°F | Ht 66.0 in | Wt 207.0 lb

## 2013-04-25 DIAGNOSIS — K219 Gastro-esophageal reflux disease without esophagitis: Secondary | ICD-10-CM | POA: Insufficient documentation

## 2013-04-25 DIAGNOSIS — R1319 Other dysphagia: Secondary | ICD-10-CM | POA: Insufficient documentation

## 2013-04-25 DIAGNOSIS — R1310 Dysphagia, unspecified: Secondary | ICD-10-CM | POA: Insufficient documentation

## 2013-04-25 DIAGNOSIS — R1314 Dysphagia, pharyngoesophageal phase: Secondary | ICD-10-CM

## 2013-04-25 NOTE — Addendum Note (Signed)
Addended by: Jennings Books on: 04/25/2013 09:37 AM   Modules accepted: Orders

## 2013-04-25 NOTE — Patient Instructions (Signed)
1. We have scheduled you for an upper endoscopy with Dr. Jena Gauss. Please see separate instructions.

## 2013-04-25 NOTE — Progress Notes (Signed)
Primary Care Physician:  Kirk Ruths, MD  Primary Gastroenterologist:  Roetta Sessions, MD   Chief Complaint  Patient presents with  . Dysphagia    HPI:  Melissa Nguyen is a 47 y.o. female here for further evaluation of acid reflux and difficulty swallowing. She states she has been on PPI therapy, omeprazole for the better part of 6-7 years. She was placed on it after her cholecystectomy years ago, she continued to have recurrent heartburn and vomiting. She did okay up until several months ago when she started having recurrent nausea, heartburn. She also noted her right tonsil seemed to be more swollen than the left and had some white spots on it towards the beginning of this year.Patient states she was seen by her PCP at least 3 times and treated with Duke's mixture, amoxicillin. Recently her omeprazole was switched to Nexium. Has helped nausea. Difficulty swallowing pills, salads, in upper esophagus. Breakthrough heartburn. No abdominal pain. Fluctuating between constipation/diarrhea. No brbpr. Radiation to right breast 2009, but denies developing esophagitis. Excedrine migraine couple times per month for headache.   Current Outpatient Prescriptions  Medication Sig Dispense Refill  . ALPRAZolam (XANAX) 1 MG tablet Take 0.5 mg by mouth at bedtime.        Marland Kitchen aspirin-acetaminophen-caffeine (EXCEDRIN MIGRAINE) 250-250-65 MG per tablet Take 1 tablet by mouth as needed. For migraine pain       . esomeprazole (NEXIUM) 40 MG capsule Take 40 mg by mouth daily.      . Multiple Vitamins-Minerals (MULTIVITAMINS THER. W/MINERALS) TABS Take 1 tablet by mouth daily.         No current facility-administered medications for this visit.    Allergies as of 04/25/2013 - Review Complete 04/25/2013  Allergen Reaction Noted  . Hydrocodone  04/01/2011    Past Medical History  Diagnosis Date  . Cancer     breast cancer, 2008  . Migraine headache   . Pyelonephritis     Past Surgical History   Procedure Laterality Date  . Cholecystectomy  2006  . Oophorectomy    . Breast surgery  2009  . Colonoscopy  07/04/2002    YNW:GNFAO internal and external hemorrhoids, otherwise normal rectum/Small polyp at 30 cm cold biopsied.  The remainder of colonic mucosa normal/    Family History  Problem Relation Age of Onset  . Colon cancer Maternal Grandmother     age 39  . Inflammatory bowel disease Neg Hx     History   Social History  . Marital Status: Married    Spouse Name: N/A    Number of Children: 1  . Years of Education: N/A   Occupational History  . [employer]    Social History Main Topics  . Smoking status: Never Smoker   . Smokeless tobacco: Not on file     Comment: quit x 30 years ago  . Alcohol Use: No  . Drug Use: No  . Sexual Activity: Not on file   Other Topics Concern  . Not on file   Social History Narrative  . No narrative on file      ROS:  General: Negative for anorexia, weight loss, fever, chills, fatigue, weakness. Eyes: Negative for vision changes.  ENT: Negative for hoarseness, difficulty swallowing , nasal congestion. CV: Negative for chest pain, angina, palpitations, dyspnea on exertion, peripheral edema.  Respiratory: Negative for dyspnea at rest, dyspnea on exertion, cough, sputum, wheezing.  GI: See history of present illness. GU:  Negative for dysuria, hematuria, urinary  incontinence, urinary frequency, nocturnal urination.  MS: Negative for joint pain, low back pain.  Derm: Negative for rash or itching.  Neuro: Negative for weakness, abnormal sensation, seizure, frequent headaches, memory loss, confusion.  Psych: Negative for anxiety, depression, suicidal ideation, hallucinations.  Endo: Negative for unusual weight change.  Heme: Negative for bruising or bleeding. Allergy: Negative for rash or hives.    Physical Examination:  BP 131/82  Pulse 92  Temp(Src) 97.4 F (36.3 C) (Oral)  Ht 5\' 6"  (1.676 m)  Wt 207 lb (93.895 kg)   BMI 33.43 kg/m2   General: Well-nourished, well-developed in no acute distress.  Head: Normocephalic, atraumatic.   Eyes: Conjunctiva pink, no icterus. Mouth: Oropharyngeal mucosa moist and pink , no lesions erythema or exudate. Neck: Supple without thyromegaly, masses, or lymphadenopathy.  Lungs: Clear to auscultation bilaterally.  Heart: Regular rate and rhythm, no murmurs rubs or gallops.  Abdomen: Bowel sounds are normal, nontender, nondistended, no hepatosplenomegaly or masses, no abdominal bruits or    hernia , no rebound or guarding.   Rectal: not performed Extremities: No lower extremity edema. No clubbing or deformities.  Neuro: Alert and oriented x 4 , grossly normal neurologically.  Skin: Warm and dry, no rash or jaundice.   Psych: Alert and cooperative, normal mood and affect.  Labs: Labs from 10-14. Sodium 138, potassium 4.4, glucose 80, BUN 14, creatinine 0.79, total bilirubin 0.5, alkaline phosphatase 83, AST 15, ALT 17, albumin 4.1, calcium 9.9, white blood cell count 7.9, hemoglobin 13, hematocrit 38, MCV 79.5, platelets 232,000.  Imaging Studies: No results found.

## 2013-04-25 NOTE — Progress Notes (Signed)
CC PCP 

## 2013-04-25 NOTE — Assessment & Plan Note (Signed)
47 y/o lady with chronic GERD on PPI for numerous years who presents with refractory heartburn, esophageal dysphagia, throat clearing. Interestingly she has had a several month history of white spots in the right tonsillar region. States she feels like she has sandpaper in the back of her throat at all times. Resembles candida. ?candida esophagitis as cause of more distal symptoms. Discussed with patient, we will proceed with upper endoscopy with possible dilation in near future. Based on findings (ie if not related to candida) she may need to consider referral to ENT for further evaluation.  I have discussed the risks, alternatives, benefits with regards to but not limited to the risk of reaction to medication, bleeding, infection, perforation and the patient is agreeable to proceed. Written consent to be obtained.  Patient states she significant oral bruising with last intubation due to irritation to her torus palatinus.

## 2013-05-02 LAB — CBC
HCT: 38 %
HGB: 13 g/dL

## 2013-05-02 LAB — COMPREHENSIVE METABOLIC PANEL
ALT: 17 U/L (ref 7–35)
Albumin: 4.1
Calcium: 9.9 mg/dL
Glucose: 80
Potassium: 4.4 mmol/L

## 2013-05-04 ENCOUNTER — Encounter (HOSPITAL_COMMUNITY): Payer: Self-pay

## 2013-05-18 ENCOUNTER — Ambulatory Visit (HOSPITAL_COMMUNITY)
Admission: RE | Admit: 2013-05-18 | Discharge: 2013-05-18 | Disposition: A | Discharge status: Home / Self Care | Payer: BC Managed Care – PPO | Source: Ambulatory Visit | Attending: Internal Medicine | Admitting: Internal Medicine

## 2013-05-18 ENCOUNTER — Telehealth: Payer: Self-pay

## 2013-05-18 ENCOUNTER — Encounter (HOSPITAL_COMMUNITY): Payer: Self-pay | Admitting: *Deleted

## 2013-05-18 ENCOUNTER — Encounter (HOSPITAL_COMMUNITY)
Admission: RE | Disposition: A | Discharge status: Home / Self Care | Payer: Self-pay | Source: Ambulatory Visit | Attending: Internal Medicine

## 2013-05-18 DIAGNOSIS — R1319 Other dysphagia: Secondary | ICD-10-CM

## 2013-05-18 DIAGNOSIS — D131 Benign neoplasm of stomach: Secondary | ICD-10-CM | POA: Insufficient documentation

## 2013-05-18 DIAGNOSIS — R1310 Dysphagia, unspecified: Secondary | ICD-10-CM

## 2013-05-18 DIAGNOSIS — K219 Gastro-esophageal reflux disease without esophagitis: Secondary | ICD-10-CM

## 2013-05-18 HISTORY — PX: ESOPHAGOGASTRODUODENOSCOPY (EGD) WITH ESOPHAGEAL DILATION: SHX5812

## 2013-05-18 SURGERY — ESOPHAGOGASTRODUODENOSCOPY (EGD) WITH ESOPHAGEAL DILATION
Anesthesia: Moderate Sedation

## 2013-05-18 MED ORDER — MEPERIDINE HCL 100 MG/ML IJ SOLN
INTRAMUSCULAR | Status: DC | PRN
  Administered 2013-05-18: 50 mg via INTRAVENOUS
  Administered 2013-05-18 (×3): 25 mg via INTRAVENOUS

## 2013-05-18 MED ORDER — MEPERIDINE HCL 100 MG/ML IJ SOLN
INTRAMUSCULAR | Status: AC
  Filled 2013-05-18: qty 2

## 2013-05-18 MED ORDER — MIDAZOLAM HCL 5 MG/5ML IJ SOLN
INTRAMUSCULAR | Status: AC
  Filled 2013-05-18: qty 10

## 2013-05-18 MED ORDER — SODIUM CHLORIDE 0.9 % IV SOLN
INTRAVENOUS | Status: DC
  Administered 2013-05-18: 10:00:00 via INTRAVENOUS

## 2013-05-18 MED ORDER — STERILE WATER FOR IRRIGATION IR SOLN
Status: DC | PRN
  Administered 2013-05-18: 11:00:00

## 2013-05-18 MED ORDER — ONDANSETRON HCL 4 MG/2ML IJ SOLN
INTRAMUSCULAR | Status: AC
  Filled 2013-05-18: qty 2

## 2013-05-18 MED ORDER — MIDAZOLAM HCL 5 MG/5ML IJ SOLN
INTRAMUSCULAR | Status: DC | PRN
  Administered 2013-05-18: 2 mg via INTRAVENOUS
  Administered 2013-05-18 (×3): 1 mg via INTRAVENOUS

## 2013-05-18 MED ORDER — ONDANSETRON HCL 4 MG/2ML IJ SOLN
INTRAMUSCULAR | Status: DC | PRN
  Administered 2013-05-18: 4 mg via INTRAVENOUS

## 2013-05-18 MED ORDER — BUTAMBEN-TETRACAINE-BENZOCAINE 2-2-14 % EX AERO
INHALATION_SPRAY | CUTANEOUS | Status: DC | PRN
  Administered 2013-05-18: 1 via TOPICAL

## 2013-05-18 NOTE — Op Note (Signed)
[facility] [address] [place], [address]   ENDOSCOPY PROCEDURE REPORT  PATIENT: Melissa, Nguyen  MR#: [id number] BIRTHDATE: Nov 17, 1965 , 47  yrs. old GENDER: Female ENDOSCOPIST: R.  Roetta Sessions, MD FACP FACG REFERRED BY:  Karleen Hampshire, M.D. PROCEDURE DATE:  05/18/2013 PROCEDURE:     EGD with Elease Hashimoto dilation and esophageal/gastric biopsy  INDICATIONS:     Esophageal dysphagia; long-standing GERD  INFORMED CONSENT:   The risks, benefits, limitations, alternatives and imponderables have been discussed.  The potential for biopsy, esophogeal dilation, etc. have also been reviewed.  Questions have been answered.  All parties agreeable.  Please see the history and physical in the medical record for more information.  MEDICATIONS:    Versed 5 mg and Demerol 125 mg IV in divided doses. Zofran 4 mg IV  DESCRIPTION OF PROCEDURE:   The EG-[id number] ([id number])  endoscope was introduced through the mouth and advanced to the second portion of the duodenum without difficulty or limitations.  The mucosal surfaces were surveyed very carefully during advancement of the scope and upon withdrawal.  Retroflexion view of the proximal stomach and esophagogastric junction was performed.      FINDINGS:   torus deformity of hard palate noted. Normal appearing, patent tubular esophagus except for question of accentuated undulating Z line versus short segment Barrett's esophagus. Stomach empty. Multiple 1-3 mm benign-appearing hyperplastic polyp. No ulcer or infiltrating process. Patent pylorus. Normal first and second portion of the duodenum.  THERAPEUTIC / DIAGNOSTIC MANEUVERS PERFORMED:  A 54 French Maloney dilator was passed to full insertion easily. A look back revealed a 2 cm superficial tear through the upper esophageal mucosa suggestion of an occult cervical web had been dilated. The tear appeared  superficial. There was minimal bleeding. Subsequent to dilation,  biopsies of the distal esophagus and gastric polyps were taken for histologic study.   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 Abnormal distal esophagus. Query short segment Barrett's-status post biopsy. Gastric polyp status post biopsy  RECOMMENDATIONS:   Clear liquid diet today -  advance to a soft diet and on from there tomorrow morning as tolerated. Continue Nexium 40 mg daily. Use Chloraseptic as needed for sore throat over the next couple of days.  Short course of tramadol 50 mg 3 times a day as needed for transient pain.   Followup on pathology    _______________________________ R. Roetta Sessions, MD FACP [facility] eSigned:  R. Roetta Sessions, MD FACP [facility] 05/18/2013 11:26 AM     CC:  PATIENT NAME:  Melissa, Nguyen MR#: [id number]

## 2013-05-18 NOTE — Progress Notes (Signed)
Patient's husband called me at 4:50 PM today. Patient's been nauseated all day long having dry heaves. She was given Zofran this morning. Has had no chest or other pain. No difficulty swallowing. I told him we will call in Zofran 4 mg tablets. Dispense 20-take 1 every 6 hours for nausea. Should subside within the next 24 hours. Clear liquid diet until nausea has subsided, then advance diet.

## 2013-05-18 NOTE — H&P (View-Only) (Signed)
Primary Care Physician:  MCGOUGH,WILLIAM M, MD  Primary Gastroenterologist:  Michael Rourk, MD   Chief Complaint  Patient presents with  . Dysphagia    HPI:  Melissa Nguyen is a 47 y.o. female here for further evaluation of acid reflux and difficulty swallowing. She states she has been on PPI therapy, omeprazole for the better part of 6-7 years. She was placed on it after her cholecystectomy years ago, she continued to have recurrent heartburn and vomiting. She did okay up until several months ago when she started having recurrent nausea, heartburn. She also noted her right tonsil seemed to be more swollen than the left and had some white spots on it towards the beginning of this year.Patient states she was seen by her PCP at least 3 times and treated with [facility] mixture, amoxicillin. Recently her omeprazole was switched to Nexium. Has helped nausea. Difficulty swallowing pills, salads, in upper esophagus. Breakthrough heartburn. No abdominal pain. Fluctuating between constipation/diarrhea. No brbpr. Radiation to right breast 2009, but denies developing esophagitis. Excedrine migraine couple times per month for headache.   Current Outpatient Prescriptions  Medication Sig Dispense Refill  . ALPRAZolam (XANAX) 1 MG tablet Take 0.5 mg by mouth at bedtime.        . aspirin-acetaminophen-caffeine (EXCEDRIN MIGRAINE) 250-250-65 MG per tablet Take 1 tablet by mouth as needed. For migraine pain       . esomeprazole (NEXIUM) 40 MG capsule Take 40 mg by mouth daily.      . Multiple Vitamins-Minerals (MULTIVITAMINS THER. W/MINERALS) TABS Take 1 tablet by mouth daily.         No current facility-administered medications for this visit.    Allergies as of 04/25/2013 - Review Complete 04/25/2013  Allergen Reaction Noted  . Hydrocodone  04/01/2011    Past Medical History  Diagnosis Date  . Cancer     breast cancer, 2008  . Migraine headache   . Pyelonephritis     Past Surgical History   Procedure Laterality Date  . Cholecystectomy  2006  . Oophorectomy    . Breast surgery  2009  . Colonoscopy  07/04/2002    RMR:Small internal and external hemorrhoids, otherwise normal rectum/Small polyp at 30 cm cold biopsied.  The remainder of colonic mucosa normal/    Family History  Problem Relation Age of Onset  . Colon cancer Maternal Grandmother     age 70  . Inflammatory bowel disease Neg Hx     History   Social History  . Marital Status: Married    Spouse Name: N/A    Number of Children: 1  . Years of Education: N/A   Occupational History  . [employer]    Social History Main Topics  . Smoking status: Never Smoker   . Smokeless tobacco: Not on file     Comment: quit x 30 years ago  . Alcohol Use: No  . Drug Use: No  . Sexual Activity: Not on file   Other Topics Concern  . Not on file   Social History Narrative  . No narrative on file      ROS:  General: Negative for anorexia, weight loss, fever, chills, fatigue, weakness. Eyes: Negative for vision changes.  ENT: Negative for hoarseness, difficulty swallowing , nasal congestion. CV: Negative for chest pain, angina, palpitations, dyspnea on exertion, peripheral edema.  Respiratory: Negative for dyspnea at rest, dyspnea on exertion, cough, sputum, wheezing.  GI: See history of present illness. GU:  Negative for dysuria, hematuria, urinary   incontinence, urinary frequency, nocturnal urination.  MS: Negative for joint pain, low back pain.  Derm: Negative for rash or itching.  Neuro: Negative for weakness, abnormal sensation, seizure, frequent headaches, memory loss, confusion.  Psych: Negative for anxiety, depression, suicidal ideation, hallucinations.  Endo: Negative for unusual weight change.  Heme: Negative for bruising or bleeding. Allergy: Negative for rash or hives.    Physical Examination:  BP 131/82  Pulse 92  Temp(Src) 97.4 F (36.3 C) (Oral)  Ht 5' 6" (1.676 m)  Wt 207 lb (93.895 kg)   BMI 33.43 kg/m2   General: Well-nourished, well-developed in no acute distress.  Head: Normocephalic, atraumatic.   Eyes: Conjunctiva pink, no icterus. Mouth: Oropharyngeal mucosa moist and pink , no lesions erythema or exudate. Neck: Supple without thyromegaly, masses, or lymphadenopathy.  Lungs: Clear to auscultation bilaterally.  Heart: Regular rate and rhythm, no murmurs rubs or gallops.  Abdomen: Bowel sounds are normal, nontender, nondistended, no hepatosplenomegaly or masses, no abdominal bruits or    hernia , no rebound or guarding.   Rectal: not performed Extremities: No lower extremity edema. No clubbing or deformities.  Neuro: Alert and oriented x 4 , grossly normal neurologically.  Skin: Warm and dry, no rash or jaundice.   Psych: Alert and cooperative, normal mood and affect.  Labs: Labs from 10-14. Sodium 138, potassium 4.4, glucose 80, BUN 14, creatinine 0.79, total bilirubin 0.5, alkaline phosphatase 83, AST 15, ALT 17, albumin 4.1, calcium 9.9, white blood cell count 7.9, hemoglobin 13, hematocrit 38, MCV 79.5, platelets 232,000.  Imaging Studies: No results found.    

## 2013-05-18 NOTE — Telephone Encounter (Signed)
T/C from Dr. Jena Gauss,  call in Zofran 4 mg tablets #20 , take one every 6 hours prn n/v, no refills. Called to [place] [facility].

## 2013-05-18 NOTE — Interval H&P Note (Signed)
   History and Physical Interval Note:  05/18/2013 10:39 AM  Melissa Nguyen  has presented today for surgery, with the diagnosis of CHRONIC GERD, DYSPHAGIA  The various methods of treatment have been discussed with the patient and family. After consideration of risks, benefits and other options for treatment, the patient has consented to  Procedure(s) with comments: ESOPHAGOGASTRODUODENOSCOPY (EGD) WITH ESOPHAGEAL DILATION (N/A) - 10:45AM as a surgical intervention .  The patient's history has been reviewed, patient examined, no change in status, stable for surgery.  I have reviewed the patient's chart and labs.  Questions were answered to the patient's satisfaction.     Melissa Nguyen  Nexium working well for reflux these days. Still with dysphagia. EGD per plan.  The risks, benefits, limitations, alternatives and imponderables have been reviewed with the patient. Potential for esophageal dilation, biopsy, etc. have also been reviewed.  Questions have been answered. All parties agreeable.

## 2013-05-21 ENCOUNTER — Encounter: Payer: Self-pay | Admitting: Internal Medicine

## 2013-05-23 ENCOUNTER — Telehealth: Payer: Self-pay

## 2013-05-23 ENCOUNTER — Encounter (HOSPITAL_COMMUNITY): Payer: Self-pay | Admitting: Internal Medicine

## 2013-05-23 NOTE — Telephone Encounter (Signed)
Letter from: Corbin Ade  Reason for Letter: Results Review Send letter to patient.  Send copy of letter with path to referring provider and PCP.    would offer 3 mo f/u appt

## 2013-05-23 NOTE — Telephone Encounter (Signed)
Letter mailed to pt. Melissa Nguyen please nic ov

## 2013-05-23 NOTE — Telephone Encounter (Signed)
Cc PCP 

## 2013-05-24 ENCOUNTER — Encounter: Payer: Self-pay | Admitting: Internal Medicine

## 2013-05-24 NOTE — Telephone Encounter (Signed)
Pt is aware of OV on 3/16 at 9 with [facility] and appt card was mailed

## 2013-07-20 ENCOUNTER — Telehealth: Payer: Self-pay | Admitting: Gastroenterology

## 2013-07-20 NOTE — Telephone Encounter (Signed)
I called and spoke with Wannetta Sender, copy service will get [place] the records

## 2013-07-20 NOTE — Telephone Encounter (Signed)
Please request copy of path report from 2004 TCS from [place].

## 2013-08-03 NOTE — Telephone Encounter (Signed)
I haven't seen any records yet.

## 2013-08-08 ENCOUNTER — Other Ambulatory Visit: Payer: Self-pay | Admitting: Family Medicine

## 2013-08-08 DIAGNOSIS — Z9889 Other specified postprocedural states: Secondary | ICD-10-CM

## 2013-08-08 DIAGNOSIS — Z853 Personal history of malignant neoplasm of breast: Secondary | ICD-10-CM

## 2013-08-08 DIAGNOSIS — R921 Mammographic calcification found on diagnostic imaging of breast: Secondary | ICD-10-CM

## 2013-08-09 NOTE — Telephone Encounter (Signed)
Received wrong report. Received 2014 egd with path instead of TCS path from 2004.  Discussed with Rosendo Gros.

## 2013-08-10 ENCOUNTER — Encounter: Payer: Self-pay | Admitting: Gastroenterology

## 2013-08-10 NOTE — Telephone Encounter (Signed)
Received copy of path from 2004 TCS. She had hyperplastic polyp. Next TCS due at age 48 (11/2016). Please NIC>

## 2013-08-11 NOTE — Telephone Encounter (Signed)
Reminder in epic °

## 2013-08-22 ENCOUNTER — Ambulatory Visit: Payer: BC Managed Care – PPO | Admitting: Gastroenterology

## 2013-09-19 ENCOUNTER — Ambulatory Visit
Admission: RE | Admit: 2013-09-19 | Discharge: 2013-09-19 | Disposition: A | Discharge status: Home / Self Care | Payer: BC Managed Care – PPO | Source: Ambulatory Visit | Attending: Family Medicine | Admitting: Family Medicine

## 2013-09-19 DIAGNOSIS — Z9889 Other specified postprocedural states: Secondary | ICD-10-CM

## 2013-09-19 DIAGNOSIS — R921 Mammographic calcification found on diagnostic imaging of breast: Secondary | ICD-10-CM

## 2013-09-19 DIAGNOSIS — Z853 Personal history of malignant neoplasm of breast: Secondary | ICD-10-CM

## 2014-01-06 ENCOUNTER — Other Ambulatory Visit (HOSPITAL_COMMUNITY): Payer: Self-pay | Admitting: Family Medicine

## 2014-01-06 ENCOUNTER — Ambulatory Visit (HOSPITAL_COMMUNITY)
Admission: RE | Admit: 2014-01-06 | Discharge: 2014-01-06 | Disposition: A | Discharge status: Home / Self Care | Payer: BC Managed Care – PPO | Source: Ambulatory Visit | Attending: Family Medicine | Admitting: Family Medicine

## 2014-01-06 DIAGNOSIS — R079 Chest pain, unspecified: Secondary | ICD-10-CM

## 2014-01-06 DIAGNOSIS — E785 Hyperlipidemia, unspecified: Secondary | ICD-10-CM | POA: Insufficient documentation

## 2014-01-06 DIAGNOSIS — R252 Cramp and spasm: Secondary | ICD-10-CM | POA: Insufficient documentation

## 2014-02-14 ENCOUNTER — Other Ambulatory Visit: Payer: Self-pay | Admitting: Family Medicine

## 2014-02-14 DIAGNOSIS — R921 Mammographic calcification found on diagnostic imaging of breast: Secondary | ICD-10-CM

## 2014-03-20 ENCOUNTER — Ambulatory Visit
Admission: RE | Admit: 2014-03-20 | Discharge: 2014-03-20 | Disposition: A | Discharge status: Home / Self Care | Payer: BC Managed Care – PPO | Source: Ambulatory Visit | Attending: Family Medicine | Admitting: Family Medicine

## 2014-03-20 DIAGNOSIS — R921 Mammographic calcification found on diagnostic imaging of breast: Secondary | ICD-10-CM

## 2015-02-14 ENCOUNTER — Other Ambulatory Visit: Payer: Self-pay | Admitting: Family Medicine

## 2015-02-14 DIAGNOSIS — R921 Mammographic calcification found on diagnostic imaging of breast: Secondary | ICD-10-CM

## 2015-02-14 DIAGNOSIS — Z1231 Encounter for screening mammogram for malignant neoplasm of breast: Secondary | ICD-10-CM

## 2015-03-23 ENCOUNTER — Ambulatory Visit
Admission: RE | Admit: 2015-03-23 | Discharge: 2015-03-23 | Disposition: A | Discharge status: Home / Self Care | Payer: BLUE CROSS/BLUE SHIELD | Source: Ambulatory Visit | Attending: Family Medicine | Admitting: Family Medicine

## 2015-03-23 DIAGNOSIS — R921 Mammographic calcification found on diagnostic imaging of breast: Secondary | ICD-10-CM

## 2015-12-21 ENCOUNTER — Encounter: Payer: Self-pay | Admitting: Genetic Counselor

## 2016-01-24 ENCOUNTER — Other Ambulatory Visit: Payer: Self-pay | Admitting: Family Medicine

## 2016-01-24 DIAGNOSIS — Z1231 Encounter for screening mammogram for malignant neoplasm of breast: Secondary | ICD-10-CM

## 2016-04-04 ENCOUNTER — Ambulatory Visit
Admission: RE | Admit: 2016-04-04 | Discharge: 2016-04-04 | Disposition: A | Discharge status: Home / Self Care | Payer: BLUE CROSS/BLUE SHIELD | Source: Ambulatory Visit | Attending: Family Medicine | Admitting: Family Medicine

## 2016-04-04 DIAGNOSIS — Z1231 Encounter for screening mammogram for malignant neoplasm of breast: Secondary | ICD-10-CM

## 2016-10-07 DIAGNOSIS — J069 Acute upper respiratory infection, unspecified: Secondary | ICD-10-CM | POA: Diagnosis not present

## 2016-10-07 DIAGNOSIS — Z1389 Encounter for screening for other disorder: Secondary | ICD-10-CM | POA: Diagnosis not present

## 2016-10-07 DIAGNOSIS — J302 Other seasonal allergic rhinitis: Secondary | ICD-10-CM | POA: Diagnosis not present

## 2016-10-07 DIAGNOSIS — K219 Gastro-esophageal reflux disease without esophagitis: Secondary | ICD-10-CM | POA: Diagnosis not present

## 2016-10-07 DIAGNOSIS — Z6833 Body mass index (BMI) 33.0-33.9, adult: Secondary | ICD-10-CM | POA: Diagnosis not present

## 2016-10-14 ENCOUNTER — Telehealth: Payer: Self-pay

## 2016-10-14 NOTE — Telephone Encounter (Signed)
Letter mailed to pt.  

## 2016-10-14 NOTE — Telephone Encounter (Signed)
RECALL FOR TCS °

## 2016-11-03 DIAGNOSIS — J069 Acute upper respiratory infection, unspecified: Secondary | ICD-10-CM | POA: Diagnosis not present

## 2016-11-07 DIAGNOSIS — E6609 Other obesity due to excess calories: Secondary | ICD-10-CM | POA: Diagnosis not present

## 2016-11-07 DIAGNOSIS — J069 Acute upper respiratory infection, unspecified: Secondary | ICD-10-CM | POA: Diagnosis not present

## 2016-11-07 DIAGNOSIS — Z6833 Body mass index (BMI) 33.0-33.9, adult: Secondary | ICD-10-CM | POA: Diagnosis not present

## 2016-11-12 DIAGNOSIS — H52223 Regular astigmatism, bilateral: Secondary | ICD-10-CM | POA: Diagnosis not present

## 2016-11-12 DIAGNOSIS — H5210 Myopia, unspecified eye: Secondary | ICD-10-CM | POA: Diagnosis not present

## 2016-11-12 DIAGNOSIS — H524 Presbyopia: Secondary | ICD-10-CM | POA: Diagnosis not present

## 2016-11-12 DIAGNOSIS — H5203 Hypermetropia, bilateral: Secondary | ICD-10-CM | POA: Diagnosis not present

## 2017-01-29 ENCOUNTER — Telehealth: Payer: Self-pay

## 2017-01-29 NOTE — Telephone Encounter (Signed)
Pt received letter from DS to schedule her colonoscopy. No GI problems. [phone] or [phone]

## 2017-02-10 ENCOUNTER — Other Ambulatory Visit: Payer: Self-pay | Admitting: Family Medicine

## 2017-02-10 ENCOUNTER — Telehealth: Payer: Self-pay

## 2017-02-10 DIAGNOSIS — Z1231 Encounter for screening mammogram for malignant neoplasm of breast: Secondary | ICD-10-CM

## 2017-02-10 NOTE — Telephone Encounter (Signed)
See separate triage.  

## 2017-02-19 NOTE — Telephone Encounter (Signed)
Gastroenterology Pre-Procedure Review  Request Date: 02/10/2017 Requesting Physician: RECALL  PATIENT REVIEW QUESTIONS: The patient responded to the following health history questions as indicated:   PT HAD COLONOSCOPY BY DR. Gala Romney 07/04/2002  1. Diabetes Melitis: no 2. Joint replacements in the past 12 months: no 3. Major health problems in the past 3 months: no 4. Has an artificial valve or MVP: no 5. Has a defibrillator: no 6. Has been advised in past to take antibiotics in advance of a procedure like teeth cleaning: no 7. Family history of colon cancer: MATERNAL GRANDMOTHER IN 62'S /  8. Alcohol Use: no 9. History of sleep apnea: no  10. History of coronary artery or other vascular stents placed within the last 12 months: no 11. History of any prior anesthesia complications: no    MEDICATIONS & ALLERGIES:    Patient reports the following regarding taking any blood thinners:   Plavix? no Aspirin? no Coumadin? no Brilinta? no Xarelto? no Eliquis? no Pradaxa? no Savaysa? no Effient? no  Patient confirms/reports the following medications:  Current Outpatient Prescriptions  Medication Sig Dispense Refill  . acetaminophen (TYLENOL) 325 MG tablet Take 650 mg by mouth every 6 (six) hours as needed. Only as needed    . ibuprofen (ADVIL,MOTRIN) 200 MG tablet Take 200 mg by mouth every 6 (six) hours as needed. Just occasionally as needed    . ranitidine (ZANTAC) 150 MG capsule Take 150 mg by mouth 2 (two) times daily. Only as needed     No current facility-administered medications for this visit.     Patient confirms/reports the following allergies:  Allergies  Allergen Reactions  . Hydrocodone Itching    No orders of the defined types were placed in this encounter.   AUTHORIZATION INFORMATION Primary Insurance:  ID #:   Group #:  Pre-Cert / Auth required: Pre-Cert / Auth #:   Secondary Insurance:   ID #:  Group #:  Pre-Cert / Auth required: Pre-Cert / Auth #:    SCHEDULE INFORMATION: Procedure has been scheduled as follows:  Date:   03/13/2017               Time:  7:30 AM Location: [facility] Short Stay  This Gastroenterology Pre-Precedure Review Form is being routed to the following provider(s): R. Garfield Cornea, MD

## 2017-02-21 NOTE — Telephone Encounter (Signed)
Ok to schedule.

## 2017-02-25 ENCOUNTER — Other Ambulatory Visit: Payer: Self-pay

## 2017-02-25 DIAGNOSIS — Z1211 Encounter for screening for malignant neoplasm of colon: Secondary | ICD-10-CM

## 2017-02-25 MED ORDER — NA SULFATE-K SULFATE-MG SULF 17.5-3.13-1.6 GM/177ML PO SOLN: 1.0000 | ORAL | 0 refills | Status: AC

## 2017-03-02 NOTE — Telephone Encounter (Signed)
Rx sent to the pharmacy and instructions have been mailed.

## 2017-03-13 ENCOUNTER — Ambulatory Visit (HOSPITAL_COMMUNITY)
Admission: RE | Admit: 2017-03-13 | Discharge: 2017-03-13 | Disposition: A | Discharge status: Home / Self Care | Payer: BLUE CROSS/BLUE SHIELD | Source: Ambulatory Visit | Attending: Internal Medicine | Admitting: Internal Medicine

## 2017-03-13 ENCOUNTER — Encounter (HOSPITAL_COMMUNITY): Payer: Self-pay

## 2017-03-13 ENCOUNTER — Encounter (HOSPITAL_COMMUNITY)
Admission: RE | Disposition: A | Discharge status: Home / Self Care | Payer: Self-pay | Source: Ambulatory Visit | Attending: Internal Medicine

## 2017-03-13 DIAGNOSIS — Z885 Allergy status to narcotic agent status: Secondary | ICD-10-CM | POA: Insufficient documentation

## 2017-03-13 DIAGNOSIS — D122 Benign neoplasm of ascending colon: Secondary | ICD-10-CM | POA: Diagnosis not present

## 2017-03-13 DIAGNOSIS — D127 Benign neoplasm of rectosigmoid junction: Secondary | ICD-10-CM | POA: Diagnosis not present

## 2017-03-13 DIAGNOSIS — Z79899 Other long term (current) drug therapy: Secondary | ICD-10-CM | POA: Diagnosis not present

## 2017-03-13 DIAGNOSIS — Z853 Personal history of malignant neoplasm of breast: Secondary | ICD-10-CM | POA: Diagnosis not present

## 2017-03-13 DIAGNOSIS — K635 Polyp of colon: Secondary | ICD-10-CM

## 2017-03-13 DIAGNOSIS — Z1211 Encounter for screening for malignant neoplasm of colon: Secondary | ICD-10-CM | POA: Diagnosis not present

## 2017-03-13 DIAGNOSIS — Z800 Family history of malignant neoplasm of digestive organs: Secondary | ICD-10-CM | POA: Insufficient documentation

## 2017-03-13 HISTORY — PX: COLONOSCOPY: SHX5424

## 2017-03-13 SURGERY — COLONOSCOPY
Anesthesia: Moderate Sedation

## 2017-03-13 MED ORDER — LIDOCAINE HCL 2 % EX GEL
CUTANEOUS | Status: AC
  Filled 2017-03-13: qty 30

## 2017-03-13 MED ORDER — SODIUM CHLORIDE 0.9 % IV SOLN
INTRAVENOUS | Status: DC
  Administered 2017-03-13: 07:00:00 via INTRAVENOUS

## 2017-03-13 MED ORDER — MIDAZOLAM HCL 5 MG/5ML IJ SOLN
INTRAMUSCULAR | Status: AC
  Filled 2017-03-13: qty 10

## 2017-03-13 MED ORDER — ONDANSETRON HCL 4 MG/2ML IJ SOLN
INTRAMUSCULAR | Status: AC
  Filled 2017-03-13: qty 2

## 2017-03-13 MED ORDER — MEPERIDINE HCL 100 MG/ML IJ SOLN
INTRAMUSCULAR | Status: AC
  Filled 2017-03-13: qty 2

## 2017-03-13 MED ORDER — ONDANSETRON HCL 4 MG/2ML IJ SOLN
4.0000 mg | Freq: Once | INTRAMUSCULAR | Status: AC
  Administered 2017-03-13: 4 mg via INTRAVENOUS

## 2017-03-13 MED ORDER — ONDANSETRON HCL 4 MG/2ML IJ SOLN
INTRAMUSCULAR | Status: DC | PRN
  Administered 2017-03-13: 4 mg via INTRAVENOUS

## 2017-03-13 MED ORDER — MIDAZOLAM HCL 5 MG/5ML IJ SOLN
INTRAMUSCULAR | Status: DC | PRN
  Administered 2017-03-13 (×3): 1 mg via INTRAVENOUS
  Administered 2017-03-13: 2 mg via INTRAVENOUS
  Administered 2017-03-13 (×3): 1 mg via INTRAVENOUS

## 2017-03-13 MED ORDER — MEPERIDINE HCL 100 MG/ML IJ SOLN
INTRAMUSCULAR | Status: DC | PRN
  Administered 2017-03-13 (×2): 25 mg
  Administered 2017-03-13: 50 mg

## 2017-03-13 NOTE — Discharge Instructions (Signed)

## 2017-03-13 NOTE — Op Note (Signed)
[facility] Patient Name: Melissa Nguyen Procedure Date: 03/13/2017 7:07 AM MRN: [id number] Date of Birth: 1965-11-23 Attending MD: Norvel Richards , MD CSN: [id number] Age: 51 Admit Type: Outpatient Procedure:                Colonoscopy Indications:              Screening for colorectal malignant neoplasm Providers:                Norvel Richards, MD, Janeece Riggers, RN, Aram Candela Referring MD:              Medicines:                Midazolam 8 mg IV, Meperidine 100 mg IV,                            Ondansetron 4 mg IV Complications:            No immediate complications. Estimated Blood Loss:     Estimated blood loss: none. Procedure:                Pre-Anesthesia Assessment:                           - Prior to the procedure, a History and Physical                            was performed, and patient medications and                            allergies were reviewed. The patient's tolerance of                            previous anesthesia was also reviewed. The risks                            and benefits of the procedure and the sedation                            options and risks were discussed with the patient.                            All questions were answered, and informed consent                            was obtained. Prior Anticoagulants: The patient has                            taken no previous anticoagulant or antiplatelet                            agents. ASA Grade Assessment: II - A patient with  mild systemic disease. After reviewing the risks                            and benefits, the patient was deemed in                            satisfactory condition to undergo the procedure.                           After obtaining informed consent, the colonoscope                            was passed under direct vision. Throughout the                            procedure, the patient's blood  pressure, pulse, and                            oxygen saturations were monitored continuously. The                            EC-[id number] ([id number]) scope was introduced through                            the anus and advanced to the the cecum, identified                            by appendiceal orifice and ileocecal valve. The                            colonoscopy was performed without difficulty. The                            patient tolerated the procedure well. The ileocecal                            valve, appendiceal orifice, and rectum were                            photographed. The quality of the bowel preparation                            was adequate. Scope In: 7:51:56 AM Scope Out: 8:14:07 AM Scope Withdrawal Time: 0 hours 7 minutes 39 seconds  Total Procedure Duration: 0 hours 22 minutes 11 seconds  Findings:      The perianal and digital rectal examinations were normal.      Two sessile polyps were found in the recto-sigmoid colon and ascending       colon. The polyps were 7 to 8 mm in size. These polyps were removed with       a hot snare. Resection and retrieval were complete. Estimated blood       loss: none.      The exam was otherwise without abnormality on direct and retroflexion       views. Impression:               -  Two 7 to 8 mm polyps at the recto-sigmoid colon                            and in the ascending colon, removed with a hot                            snare. Resected and retrieved.                           - The examination was otherwise normal on direct                            and retroflexion views. Moderate Sedation:      Moderate (conscious) sedation was administered by the endoscopy nurse       and supervised by the endoscopist. The following parameters were       monitored: oxygen saturation, heart rate, blood pressure, respiratory       rate, EKG, adequacy of pulmonary ventilation, and response to care.       Total physician  intraservice time was 32 minutes. Recommendation:           - Patient has a contact number available for                            emergencies. The signs and symptoms of potential                            delayed complications were discussed with the                            patient. Return to normal activities tomorrow.                            Written discharge instructions were provided to the                            patient.                           - Resume previous diet.                           - Continue present medications.                           - Await pathology results.                           - Repeat colonoscopy date to be determined after                            pending pathology results are reviewed for                            surveillance based on pathology results.                           -  Return to GI clinic (date not yet determined). Procedure Code(s):        --- Professional ---                           [phone], Colonoscopy, flexible; with removal of                            tumor(s), polyp(s), or other lesion(s) by snare                            technique                           99152, Moderate sedation services provided by the                            same physician or other qualified health care                            professional performing the diagnostic or                            therapeutic service that the sedation supports,                            requiring the presence of an independent trained                            observer to assist in the monitoring of the                            patient's level of consciousness and physiological                            status; initial 15 minutes of intraservice time,                            patient age 102 years or older                           [phone], Moderate sedation services; each additional                            15 minutes intraservice time Diagnosis  Code(s):        --- Professional ---                           Z12.11, Encounter for screening for malignant                            neoplasm of colon                           D12.7, Benign neoplasm of rectosigmoid junction  D12.2, Benign neoplasm of ascending colon CPT copyright 2016 American Medical Association. All rights reserved. The codes documented in this report are preliminary and upon coder review may  be revised to meet current compliance requirements. Cristopher Estimable. Jaizon Deroos, MD Norvel Richards, MD 03/13/2017 8:28:35 AM This report has been signed electronically. Number of Addenda: 0

## 2017-03-13 NOTE — Progress Notes (Signed)
Advised Dr Gala Romney that patient is vomiting.  Clear liquid.  Verbal order with repeat:  4mg  Zofran IV for vomiting now.  Dinah Beers, RN administering medication

## 2017-03-13 NOTE — H&P (Signed)
_0 @   Primary Care Physician:  Sharilyn Sites, MD Primary Gastroenterologist:  Dr. Gala Romney  Pre-Procedure History & Physical: HPI:  Melissa Nguyen is a 51 y.o. female here for average risk screening colonoscopy. No bowel symptoms. No family history of colon cancer in first-degree relatives.  Past Medical History:  Diagnosis Date  . Breast cancer ([place])    breast cancer, 2008, xrt and chemo. Stage I  . Migraine headache   . Pyelonephritis     Past Surgical History:  Procedure Laterality Date  . BILATERAL SALPINGOOPHORECTOMY  04/2008   due to breast cancer  . BREAST SURGERY  2008  . CHOLECYSTECTOMY  2006  . COLONOSCOPY  07/04/2002   [id number]:[id number] internal and external hemorrhoids, otherwise normal rectum/Small polyp at 30 cm cold biopsied.  The remainder of colonic mucosa normal/ (hyperplastic polyp). Next TCS at age 14.  Marland Kitchen ESOPHAGOGASTRODUODENOSCOPY (EGD) WITH ESOPHAGEAL DILATION N/A 05/18/2013   Procedure: ESOPHAGOGASTRODUODENOSCOPY (EGD) WITH ESOPHAGEAL DILATION;  Surgeon: Daneil Dolin, MD;  Location: [facility] SUITE;  Service: Endoscopy;  Laterality: N/A;  10:45AM  . lymph nodes  2009   with port-a-cath    Prior to Admission medications   Medication Sig Start Date End Date Taking? Authorizing Provider  acetaminophen (TYLENOL) 500 MG tablet Take 1,000 mg by mouth every 8 (eight) hours as needed for mild pain or headache.   Yes [provider]  clobetasol ointment (TEMOVATE) 7.61 % Apply 1 application topically daily.   Yes [provider]  Na Sulfate-K Sulfate-Mg Sulf (SUPREP BOWEL PREP KIT) 17.5-3.13-1.6 GM/180ML SOLN Take 1 kit by mouth as directed. Patient taking differently: Take 1 kit by mouth as directed. Will do prior to procedure 02/25/17  Yes Jhoan Schmieder, Cristopher Estimable, MD  ranitidine (ZANTAC) 150 MG capsule Take 150 mg by mouth daily as needed for heartburn. Only as needed    Yes [provider]    Allergies as of 02/25/2017 - Review Complete 02/10/2017   Allergen Reaction Noted  . Hydrocodone Itching 04/01/2011    Family History  Problem Relation Age of Onset  . Colon cancer Maternal Grandmother        age 9  . Inflammatory bowel disease Neg Hx     Social History   Social History  . Marital status: Married    Spouse name: N/A  . Number of children: 1  . Years of education: N/A   Occupational History  . farm [place] History Main Topics  . Smoking status: Never Smoker  . Smokeless tobacco: Never Used     Comment: quit x 30 years ago  . Alcohol use No  . Drug use: No  . Sexual activity: Not on file   Other Topics Concern  . Not on file   Social History Narrative  . No narrative on file    Review of Systems: See HPI, otherwise negative ROS  Physical Exam: BP 127/66   Pulse 90   Temp 98.6 F (37 C) (Oral)   Resp 20   Ht _1  (1.676 m)   Wt 213 lb (96.6 kg)   SpO2 94%   BMI 34.38 kg/m  General:   Alert,  Well-developed, well-nourished, pleasant and cooperative in NAD Neck:  Supple; no masses or thyromegaly. No significant cervical adenopathy. Lungs:  Clear throughout to auscultation.   No wheezes, crackles, or rhonchi. No acute distress. Heart:  Regular rate and rhythm; no murmurs, clicks, rubs,  or gallops. Abdomen: Non-distended, normal bowel  sounds.  Soft and nontender without appreciable mass or hepatosplenomegaly.  Pulses:  Normal pulses noted. Extremities:  Without clubbing or edema.  1. Impression:  Pleasant 51 year old lady here for average risk screening colonoscopy.  Recommendations:  I have offered the patient a screening colonoscopy today.  The risks, benefits, limitations, alternatives and imponderables have been reviewed with the patient. Questions have been answered. All parties are agreeable.    Notice: This dictation was prepared with Dragon dictation along with smaller phrase technology. Any transcriptional errors that result from this process are unintentional and  may not be corrected upon review.

## 2017-03-16 ENCOUNTER — Encounter (HOSPITAL_COMMUNITY): Payer: Self-pay | Admitting: Internal Medicine

## 2017-03-22 ENCOUNTER — Encounter: Payer: Self-pay | Admitting: Internal Medicine

## 2017-03-24 DIAGNOSIS — Z01419 Encounter for gynecological examination (general) (routine) without abnormal findings: Secondary | ICD-10-CM | POA: Diagnosis not present

## 2017-03-24 DIAGNOSIS — Z6835 Body mass index (BMI) 35.0-35.9, adult: Secondary | ICD-10-CM | POA: Diagnosis not present

## 2017-03-31 DIAGNOSIS — Z853 Personal history of malignant neoplasm of breast: Secondary | ICD-10-CM | POA: Diagnosis not present

## 2017-03-31 DIAGNOSIS — M5410 Radiculopathy, site unspecified: Secondary | ICD-10-CM | POA: Diagnosis not present

## 2017-03-31 DIAGNOSIS — Z1389 Encounter for screening for other disorder: Secondary | ICD-10-CM | POA: Diagnosis not present

## 2017-03-31 DIAGNOSIS — M545 Low back pain: Secondary | ICD-10-CM | POA: Diagnosis not present

## 2017-04-03 ENCOUNTER — Ambulatory Visit
Admission: RE | Admit: 2017-04-03 | Discharge: 2017-04-03 | Disposition: A | Discharge status: Home / Self Care | Payer: BLUE CROSS/BLUE SHIELD | Source: Ambulatory Visit | Attending: Family Medicine | Admitting: Family Medicine

## 2017-04-03 DIAGNOSIS — Z1231 Encounter for screening mammogram for malignant neoplasm of breast: Secondary | ICD-10-CM | POA: Diagnosis not present

## 2017-04-03 HISTORY — DX: Personal history of antineoplastic chemotherapy: Z92.21

## 2017-04-03 HISTORY — DX: Personal history of irradiation: Z92.3

## 2017-04-15 ENCOUNTER — Other Ambulatory Visit (HOSPITAL_COMMUNITY): Payer: Self-pay | Admitting: Family Medicine

## 2017-04-15 DIAGNOSIS — Z853 Personal history of malignant neoplasm of breast: Secondary | ICD-10-CM

## 2017-04-15 DIAGNOSIS — M545 Low back pain: Secondary | ICD-10-CM

## 2017-04-15 DIAGNOSIS — M5410 Radiculopathy, site unspecified: Secondary | ICD-10-CM

## 2017-04-23 ENCOUNTER — Ambulatory Visit (HOSPITAL_COMMUNITY)
Admission: RE | Admit: 2017-04-23 | Discharge: 2017-04-23 | Disposition: A | Discharge status: Home / Self Care | Payer: BLUE CROSS/BLUE SHIELD | Source: Ambulatory Visit | Attending: Family Medicine | Admitting: Family Medicine

## 2017-04-23 DIAGNOSIS — M545 Low back pain: Secondary | ICD-10-CM

## 2017-04-23 DIAGNOSIS — Z853 Personal history of malignant neoplasm of breast: Secondary | ICD-10-CM

## 2017-04-23 DIAGNOSIS — M1288 Other specific arthropathies, not elsewhere classified, other specified site: Secondary | ICD-10-CM | POA: Insufficient documentation

## 2017-04-23 DIAGNOSIS — M5410 Radiculopathy, site unspecified: Secondary | ICD-10-CM | POA: Insufficient documentation

## 2017-07-28 DIAGNOSIS — Z1389 Encounter for screening for other disorder: Secondary | ICD-10-CM | POA: Diagnosis not present

## 2017-07-28 DIAGNOSIS — Z6832 Body mass index (BMI) 32.0-32.9, adult: Secondary | ICD-10-CM | POA: Diagnosis not present

## 2017-07-28 DIAGNOSIS — E6609 Other obesity due to excess calories: Secondary | ICD-10-CM | POA: Diagnosis not present

## 2017-07-28 DIAGNOSIS — J029 Acute pharyngitis, unspecified: Secondary | ICD-10-CM | POA: Diagnosis not present

## 2017-07-28 DIAGNOSIS — J069 Acute upper respiratory infection, unspecified: Secondary | ICD-10-CM | POA: Diagnosis not present

## 2017-09-15 DIAGNOSIS — J309 Allergic rhinitis, unspecified: Secondary | ICD-10-CM | POA: Diagnosis not present

## 2017-09-15 DIAGNOSIS — Z1389 Encounter for screening for other disorder: Secondary | ICD-10-CM | POA: Diagnosis not present

## 2017-09-15 DIAGNOSIS — E6609 Other obesity due to excess calories: Secondary | ICD-10-CM | POA: Diagnosis not present

## 2017-09-15 DIAGNOSIS — J302 Other seasonal allergic rhinitis: Secondary | ICD-10-CM | POA: Diagnosis not present

## 2017-09-15 DIAGNOSIS — R0981 Nasal congestion: Secondary | ICD-10-CM | POA: Diagnosis not present

## 2017-09-15 DIAGNOSIS — R05 Cough: Secondary | ICD-10-CM | POA: Diagnosis not present

## 2017-09-15 DIAGNOSIS — Z6832 Body mass index (BMI) 32.0-32.9, adult: Secondary | ICD-10-CM | POA: Diagnosis not present

## 2018-01-20 DIAGNOSIS — Z6833 Body mass index (BMI) 33.0-33.9, adult: Secondary | ICD-10-CM | POA: Diagnosis not present

## 2018-01-20 DIAGNOSIS — Z1389 Encounter for screening for other disorder: Secondary | ICD-10-CM | POA: Diagnosis not present

## 2018-01-20 DIAGNOSIS — R040 Epistaxis: Secondary | ICD-10-CM | POA: Diagnosis not present

## 2018-01-20 DIAGNOSIS — E6609 Other obesity due to excess calories: Secondary | ICD-10-CM | POA: Diagnosis not present

## 2018-01-21 DIAGNOSIS — E6609 Other obesity due to excess calories: Secondary | ICD-10-CM | POA: Diagnosis not present

## 2018-01-21 DIAGNOSIS — Z6833 Body mass index (BMI) 33.0-33.9, adult: Secondary | ICD-10-CM | POA: Diagnosis not present

## 2018-01-21 DIAGNOSIS — Z0000 Encounter for general adult medical examination without abnormal findings: Secondary | ICD-10-CM | POA: Diagnosis not present

## 2018-01-21 DIAGNOSIS — E782 Mixed hyperlipidemia: Secondary | ICD-10-CM | POA: Diagnosis not present

## 2018-01-21 DIAGNOSIS — Z1389 Encounter for screening for other disorder: Secondary | ICD-10-CM | POA: Diagnosis not present

## 2018-02-03 ENCOUNTER — Other Ambulatory Visit: Payer: Self-pay | Admitting: Family Medicine

## 2018-02-03 DIAGNOSIS — Z1231 Encounter for screening mammogram for malignant neoplasm of breast: Secondary | ICD-10-CM

## 2018-04-05 ENCOUNTER — Ambulatory Visit
Admission: RE | Admit: 2018-04-05 | Discharge: 2018-04-05 | Disposition: A | Discharge status: Home / Self Care | Payer: BLUE CROSS/BLUE SHIELD | Source: Ambulatory Visit | Attending: Family Medicine | Admitting: Family Medicine

## 2018-04-05 DIAGNOSIS — Z1231 Encounter for screening mammogram for malignant neoplasm of breast: Secondary | ICD-10-CM

## 2018-04-09 DIAGNOSIS — Z1389 Encounter for screening for other disorder: Secondary | ICD-10-CM | POA: Diagnosis not present

## 2018-04-09 DIAGNOSIS — N029 Recurrent and persistent hematuria with unspecified morphologic changes: Secondary | ICD-10-CM | POA: Diagnosis not present

## 2018-04-09 DIAGNOSIS — Z0001 Encounter for general adult medical examination with abnormal findings: Secondary | ICD-10-CM | POA: Diagnosis not present

## 2018-04-09 DIAGNOSIS — J302 Other seasonal allergic rhinitis: Secondary | ICD-10-CM | POA: Diagnosis not present

## 2018-04-09 DIAGNOSIS — R040 Epistaxis: Secondary | ICD-10-CM | POA: Diagnosis not present

## 2018-04-09 DIAGNOSIS — Z6834 Body mass index (BMI) 34.0-34.9, adult: Secondary | ICD-10-CM | POA: Diagnosis not present

## 2018-04-09 IMAGING — MG 2D DIGITAL SCREENING BILATERAL MAMMOGRAM WITH CAD AND ADJUNCT TO
8 of 12 series · 8 of 28 positions shown · non-contrast
Comparison: Previous exam(s).

CLINICAL DATA: Screening.

EXAM:
2D DIGITAL SCREENING BILATERAL MAMMOGRAM WITH CAD AND ADJUNCT TOMO

[L CC synth-2D]
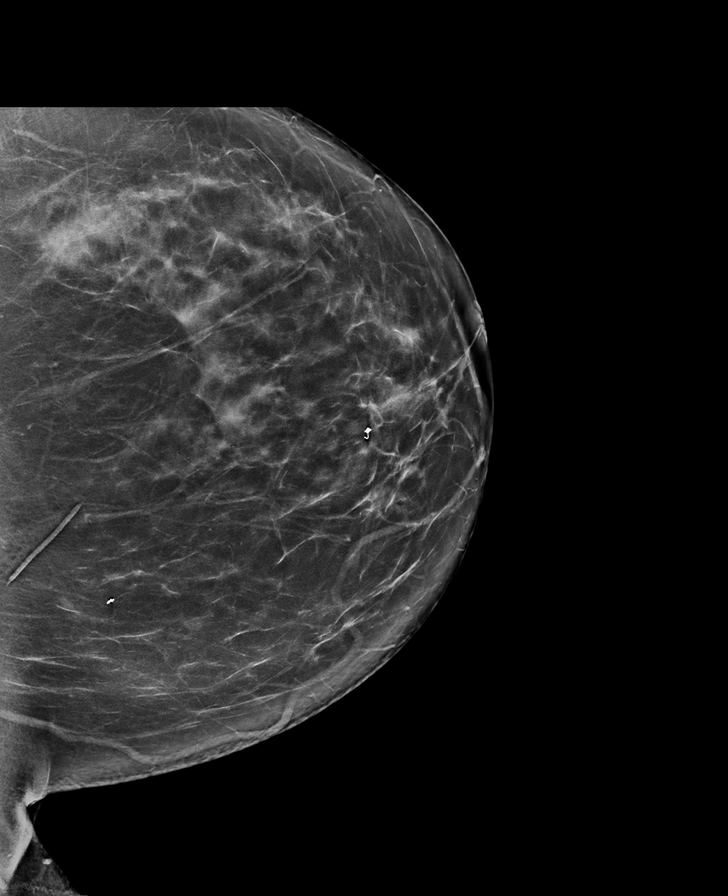

[L MLO synth-2D]
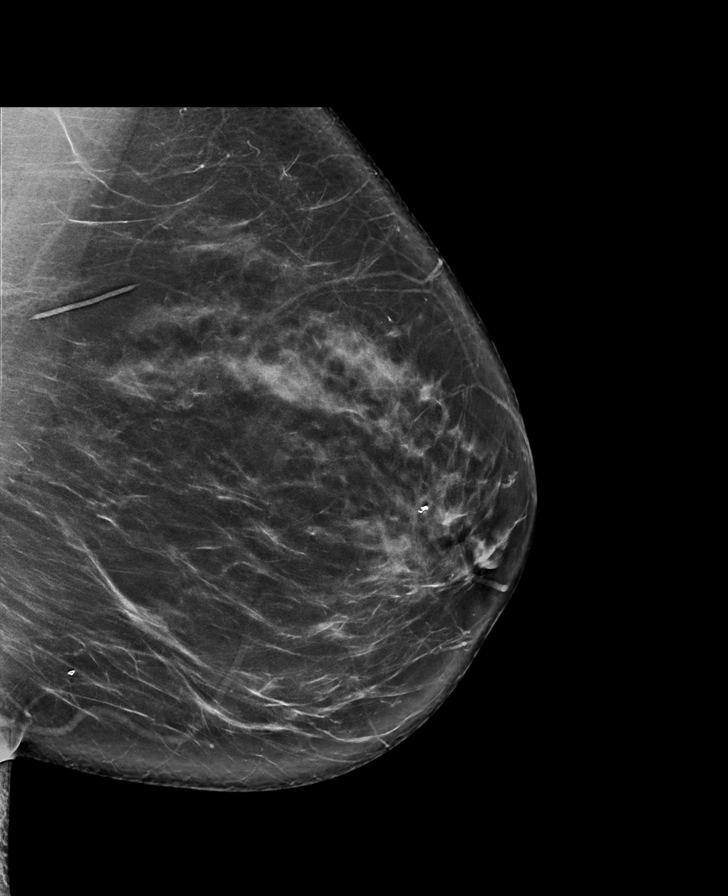

[R CC synth-2D]
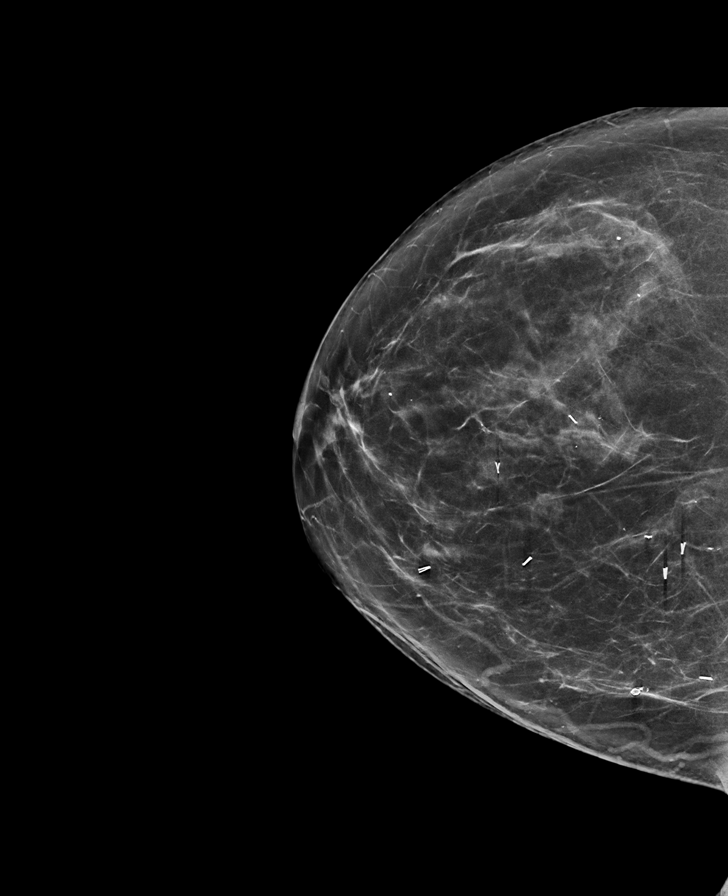

[L MLO]
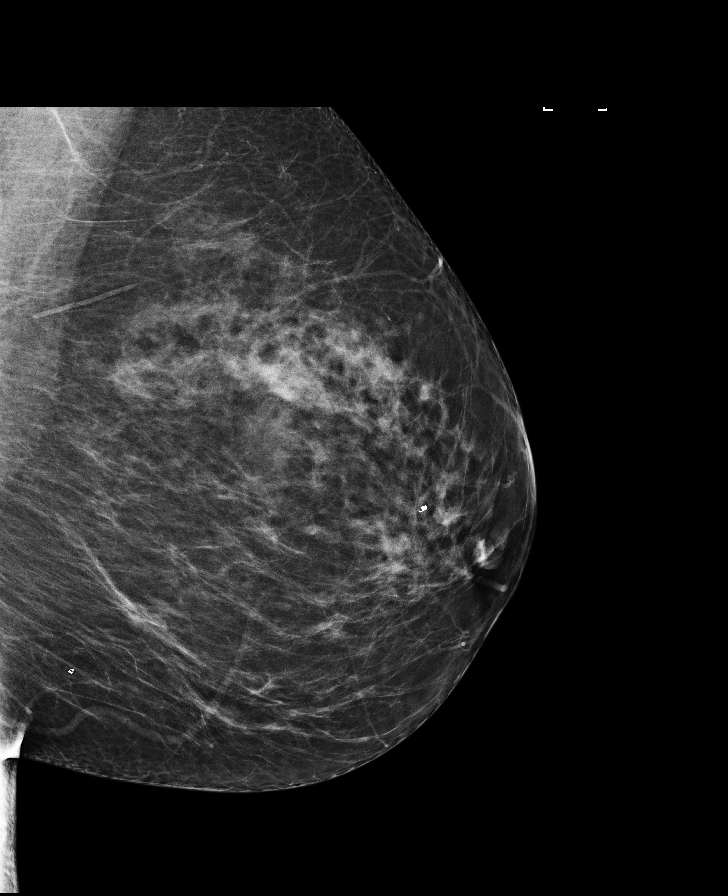

[R MLO synth-2D]
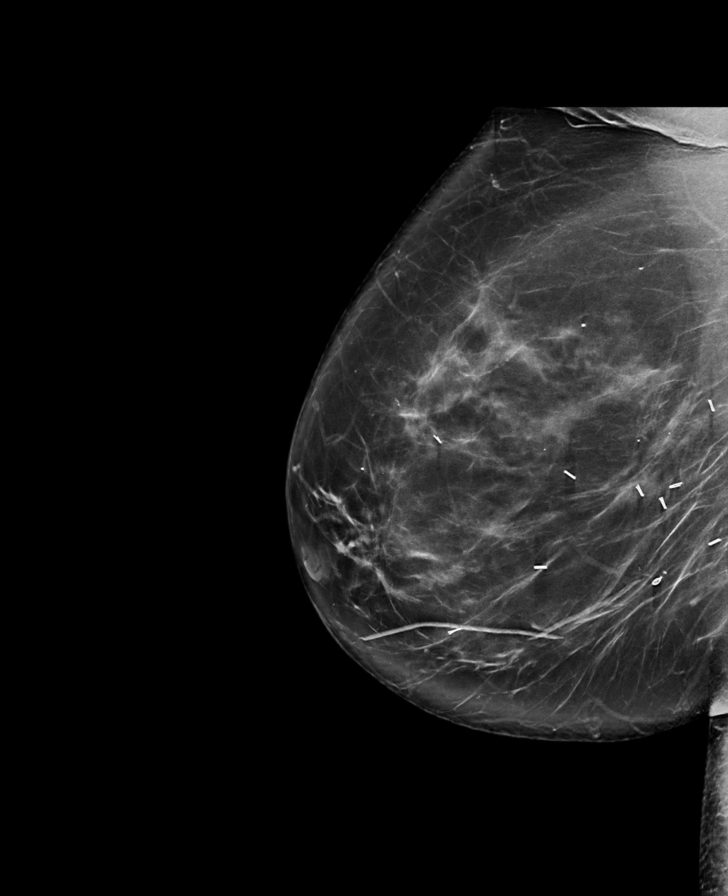

[L CC]
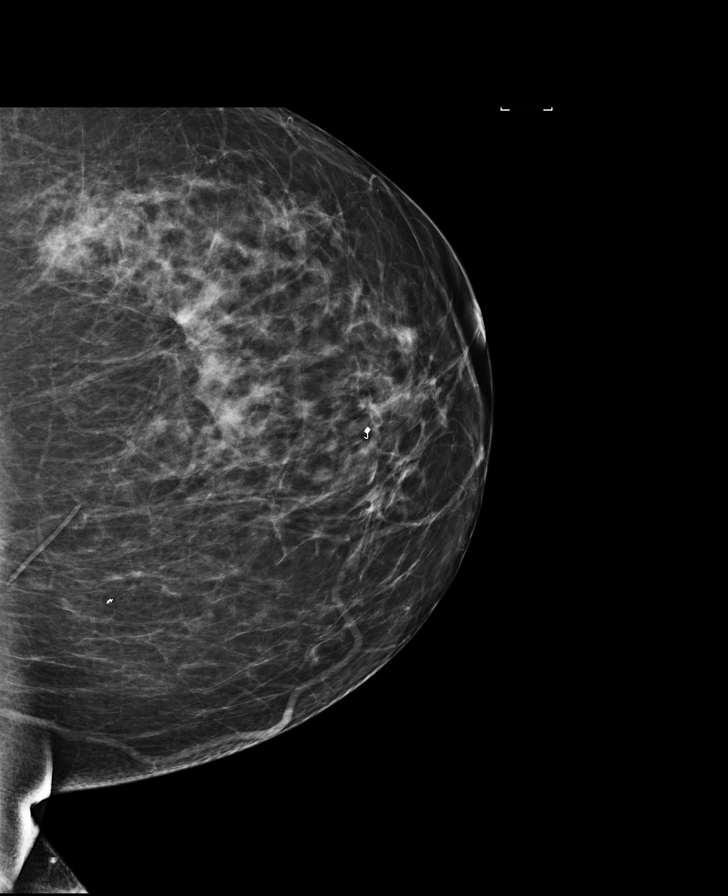

[R CC]
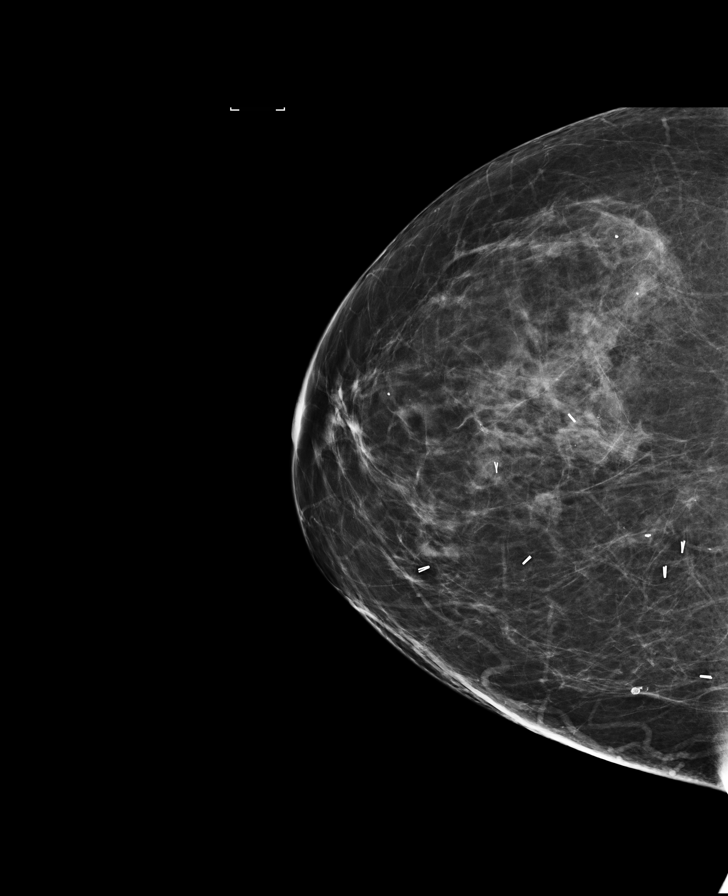

[R MLO]
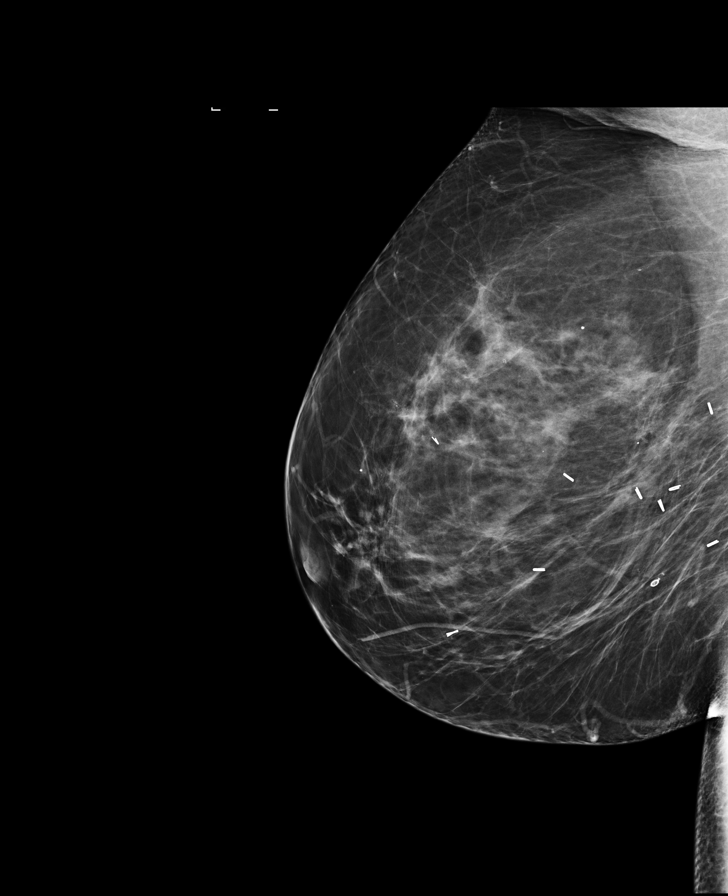

[8 of 28 positions shown; findings below may reference images not displayed]

ACR Breast Density Category c: The breast tissue is heterogeneously
dense, which may obscure small masses.
FINDINGS: There are no findings suspicious for malignancy. Images were
processed with CAD.
IMPRESSION: No mammographic evidence of malignancy. A result letter of this
screening mammogram will be mailed directly to the patient.

RECOMMENDATION:
Screening mammogram in one year. (Code:[id number])

BI-RADS CATEGORY  1: Negative.

## 2018-05-13 ENCOUNTER — Ambulatory Visit (INDEPENDENT_AMBULATORY_CARE_PROVIDER_SITE_OTHER): Payer: BLUE CROSS/BLUE SHIELD | Admitting: Otolaryngology

## 2018-07-05 DIAGNOSIS — H5213 Myopia, bilateral: Secondary | ICD-10-CM | POA: Diagnosis not present

## 2018-07-05 DIAGNOSIS — H04123 Dry eye syndrome of bilateral lacrimal glands: Secondary | ICD-10-CM | POA: Diagnosis not present

## 2018-07-05 DIAGNOSIS — H52223 Regular astigmatism, bilateral: Secondary | ICD-10-CM | POA: Diagnosis not present

## 2018-07-05 DIAGNOSIS — H5203 Hypermetropia, bilateral: Secondary | ICD-10-CM | POA: Diagnosis not present

## 2018-07-05 DIAGNOSIS — H35462 Secondary vitreoretinal degeneration, left eye: Secondary | ICD-10-CM | POA: Diagnosis not present

## 2018-08-15 DIAGNOSIS — R05 Cough: Secondary | ICD-10-CM | POA: Diagnosis not present

## 2018-08-15 DIAGNOSIS — J101 Influenza due to other identified influenza virus with other respiratory manifestations: Secondary | ICD-10-CM | POA: Diagnosis not present

## 2019-01-14 DIAGNOSIS — J329 Chronic sinusitis, unspecified: Secondary | ICD-10-CM | POA: Diagnosis not present

## 2019-01-14 DIAGNOSIS — E6609 Other obesity due to excess calories: Secondary | ICD-10-CM | POA: Diagnosis not present

## 2019-01-14 DIAGNOSIS — H9312 Tinnitus, left ear: Secondary | ICD-10-CM | POA: Diagnosis not present

## 2019-01-14 DIAGNOSIS — Z6836 Body mass index (BMI) 36.0-36.9, adult: Secondary | ICD-10-CM | POA: Diagnosis not present

## 2019-01-14 DIAGNOSIS — H6592 Unspecified nonsuppurative otitis media, left ear: Secondary | ICD-10-CM | POA: Diagnosis not present

## 2019-02-08 ENCOUNTER — Other Ambulatory Visit: Payer: Self-pay | Admitting: Family Medicine

## 2019-02-08 DIAGNOSIS — Z1231 Encounter for screening mammogram for malignant neoplasm of breast: Secondary | ICD-10-CM

## 2019-03-28 ENCOUNTER — Other Ambulatory Visit: Payer: Self-pay

## 2019-03-28 ENCOUNTER — Ambulatory Visit
Admission: RE | Admit: 2019-03-28 | Discharge: 2019-03-28 | Disposition: A | Discharge status: Home / Self Care | Payer: BC Managed Care – PPO | Source: Ambulatory Visit | Attending: Family Medicine | Admitting: Family Medicine

## 2019-03-28 DIAGNOSIS — Z1231 Encounter for screening mammogram for malignant neoplasm of breast: Secondary | ICD-10-CM

## 2019-04-15 ENCOUNTER — Other Ambulatory Visit: Payer: Self-pay

## 2019-04-15 DIAGNOSIS — Z20822 Contact with and (suspected) exposure to covid-19: Secondary | ICD-10-CM

## 2019-04-16 LAB — NOVEL CORONAVIRUS, NAA: SARS-CoV-2, NAA: NOT DETECTED

## 2019-08-25 ENCOUNTER — Ambulatory Visit: Payer: Self-pay | Attending: Internal Medicine

## 2019-08-25 DIAGNOSIS — Z23 Encounter for immunization: Secondary | ICD-10-CM

## 2019-08-25 NOTE — Progress Notes (Signed)
   Covid-19 Vaccination Clinic  Name:  Melissa Nguyen    MRN: [id number] DOB: 09/16/1965  08/25/2019  [name] was observed post Covid-19 immunization for 15 minutes without incident. She was provided with Vaccine Information Sheet and instruction to access the V-Safe system.   [name] was instructed to call 911 with any severe reactions post vaccine: Marland Kitchen Difficulty breathing  . Swelling of face and throat  . A fast heartbeat  . A bad rash all over body  . Dizziness and weakness   Immunizations Administered    Name Date Dose VIS Date Route   Pfizer COVID-19 Vaccine 08/25/2019  2:50 PM 0.3 mL 05/20/2019 Intramuscular   Manufacturer: [place]   Lot: [id number]   [place]: [id number]

## 2019-09-19 ENCOUNTER — Ambulatory Visit: Payer: Self-pay | Attending: Internal Medicine

## 2019-09-19 DIAGNOSIS — Z23 Encounter for immunization: Secondary | ICD-10-CM

## 2019-09-19 NOTE — Progress Notes (Signed)
   Covid-19 Vaccination Clinic  Name:  Melissa Nguyen    MRN: [id number] DOB: 10-17-1965  09/19/2019  Melissa Nguyen was observed post Covid-19 immunization for 15 minutes without incident. She was provided with Vaccine Information Sheet and instruction to access the V-Safe system.   Melissa Nguyen was instructed to call 911 with any severe reactions post vaccine: Marland Kitchen Difficulty breathing  . Swelling of face and throat  . A fast heartbeat  . A bad rash all over body  . Dizziness and weakness   Immunizations Administered    Name Date Dose VIS Date Route   Pfizer COVID-19 Vaccine 09/19/2019  2:44 PM 0.3 mL 05/20/2019 Intramuscular   Manufacturer: [place]   Lot: [id number]   [place]: [id number]

## 2019-09-23 ENCOUNTER — Other Ambulatory Visit: Payer: Self-pay

## 2019-09-23 ENCOUNTER — Ambulatory Visit
Admission: EM | Admit: 2019-09-23 | Discharge: 2019-09-23 | Disposition: A | Discharge status: Home / Self Care | Payer: BC Managed Care – PPO

## 2019-09-23 DIAGNOSIS — T50Z95A Adverse effect of other vaccines and biological substances, initial encounter: Secondary | ICD-10-CM

## 2019-09-23 DIAGNOSIS — R599 Enlarged lymph nodes, unspecified: Secondary | ICD-10-CM

## 2019-09-23 NOTE — ED Provider Notes (Signed)
[place]   [id number] 09/23/19 Arrival Time: 53  Cc: HA and LAD  SUBJECTIVE:  Melissa Nguyen is a 54 y.o. female who presents with headache, now resolved, and LT supraclavicular LAD x 3 days.  Received second dose of pfizer vaccine 4 days ago.  Has tried warm compresses and OTC medication with relief.  Symptoms are made worse with to the touch.  Denies previous symptoms in the past.   Denies fever, chills, nausea, vomiting, erythema, rhinorrhea, congestion, sore throat, cough, SOB, chest pain, changes in bowel or bladder function.     Hx of stage 1 breast cancer in RT breast, has been in remission for 12 years.  Denies breast lumps or tenderness, axillary LAD.    ROS: As per HPI.  All other pertinent ROS negative.     Past Medical History:  Diagnosis Date  . Breast cancer ([place]) 2008   breast cancer, 2008, xrt and chemo. Stage I right br  . Migraine headache   . Personal history of chemotherapy 2008  . Personal history of radiation therapy 2008  . Pyelonephritis    Past Surgical History:  Procedure Laterality Date  . BILATERAL SALPINGOOPHORECTOMY  04/2008   due to breast cancer  . BREAST LUMPECTOMY Right 2008   malignant  chemo and radiation  . BREAST SURGERY  2008  . CHOLECYSTECTOMY  2006  . COLONOSCOPY  07/04/2002   IRS:WNIOE internal and external hemorrhoids, otherwise normal rectum/Small polyp at 30 cm cold biopsied.  The remainder of colonic mucosa normal/ (hyperplastic polyp). Next TCS at age 89.  Marland Kitchen COLONOSCOPY N/A 03/13/2017   Procedure: COLONOSCOPY;  Surgeon: Daneil Dolin, MD;  [facility];  Service: Endoscopy;  Laterality: N/A;  7:30  . ESOPHAGOGASTRODUODENOSCOPY (EGD) WITH ESOPHAGEAL DILATION N/A 05/18/2013   Procedure: ESOPHAGOGASTRODUODENOSCOPY (EGD) WITH ESOPHAGEAL DILATION;  Surgeon: Daneil Dolin, MD;  [facility];  Service: Endoscopy;  Laterality: N/A;  10:45AM  . lymph nodes  2009   with port-a-cath   Allergies    Allergen Reactions  . Hydrocodone Itching   No current facility-administered medications on file prior to encounter.   Current Outpatient Medications on File Prior to Encounter  Medication Sig Dispense Refill  . acetaminophen (TYLENOL) 500 MG tablet Take 1,000 mg by mouth every 8 (eight) hours as needed for mild pain or headache.    . Multiple Vitamin (MULTIVITAMIN WITH MINERALS) TABS tablet Take 1 tablet by mouth daily.    . clobetasol ointment (TEMOVATE) 7.03 % Apply 1 application topically daily.    . Na Sulfate-K Sulfate-Mg Sulf (SUPREP BOWEL PREP KIT) 17.5-3.13-1.6 GM/180ML SOLN Take 1 kit by mouth as directed. (Patient taking differently: Take 1 kit by mouth as directed. Will do prior to procedure) 1 Bottle 0  . ranitidine (ZANTAC) 150 MG capsule Take 150 mg by mouth daily as needed for heartburn. Only as needed       Social History   Socioeconomic History  . Marital status: Married    Spouse name: Not on file  . Number of children: 1  . Years of education: Not on file  . Highest education level: Not on file  Occupational History  . Occupation: farm Psychiatric nurse: [employer]  Tobacco Use  . Smoking status: Never Smoker  . Smokeless tobacco: Never Used  . Tobacco comment: quit x 30 years ago  Substance and Sexual Activity  . Alcohol use: No  . Drug use: No  . Sexual  activity: Not on file  Other Topics Concern  . Not on file  Social History Narrative  . Not on file   Social Determinants of Health   Financial Resource Strain:   . Difficulty of Paying Living Expenses:   Food Insecurity:   . Worried About Charity fundraiser in the Last Year:   . Arboriculturist in the Last Year:   Transportation Needs:   . Film/video editor (Medical):   Marland Kitchen Lack of Transportation (Non-Medical):   Physical Activity:   . Days of Exercise per Week:   . Minutes of Exercise per Session:   Stress:   . Feeling of Stress :   Social Connections:   . Frequency of  Communication with Friends and Family:   . Frequency of Social Gatherings with Friends and Family:   . Attends Religious Services:   . Active Member of Clubs or Organizations:   . Attends Archivist Meetings:   Marland Kitchen Marital Status:   Intimate Partner Violence:   . Fear of Current or Ex-Partner:   . Emotionally Abused:   Marland Kitchen Physically Abused:   . Sexually Abused:    Family History  Problem Relation Age of Onset  . Colon cancer Maternal Grandmother        age 71  . Hypertension Mother   . Healthy Father   . Inflammatory bowel disease Neg Hx      OBJECTIVE:  Vitals:   09/23/19 1350  BP: (!) 151/80  Pulse: 93  Resp: 17  Temp: 98.5 F (36.9 C)  TempSrc: Oral  SpO2: 97%    General appearance: Alert, speaking in full sentences without difficulty HEENT:NCAT; no obvious facial swelling; Ears: EACs clear; Eyes: PERRL.  EOM grossly intact. Nose: nares patent without rhinorrhea; Throat: tonsils nonerythematous or enlarged, uvula midline Neck: 1 cm solitary lymph node swelling to LT supraclavicular region, mildly TTP, freely moveable; no axillary LAD or discomfort Lungs: clear to auscultation bilaterally without adventitious breath sounds; normal respiratory effort; no labored respirations Heart: regular rate and rhythm. Skin: warm and dry Psychological: alert and cooperative; normal mood and affect  ASSESSMENT & PLAN:  1. Swollen lymph nodes   2. Adverse effect of vaccine, initial encounter    Rest and push fluids Use ice and warm compresses as needed Alternate ibuprofen and/or tylenol as needed for pain and swelling Follow up with PCP next week for recheck and to ensure symptoms are improving Go to the ED if you have any new or worsening symptoms such as difficulty breathing, shortness of breath, chest pain, nausea, vomiting, slurred speech, facial droop, weakness in arms or legs, etc...   Reviewed expectations re: course of current medical issues. Questions  answered. Outlined signs and symptoms indicating need for more acute intervention. Patient verbalized understanding. After Visit Summary given.          Lestine Box, PA-C 09/23/19 1417

## 2019-09-23 NOTE — ED Triage Notes (Signed)
Pt presents with complaints of headache and swollen lymph nodes. She got her second covid vaccine on Monday and symptoms started on Tuesday. Denies any other symptoms.

## 2019-09-23 NOTE — Discharge Instructions (Signed)
Rest and push fluids Use ice and warm compresses as needed Alternate ibuprofen and/or tylenol as needed for pain and swelling Follow up with PCP next week for recheck and to ensure symptoms are improving Go to the ED if you have any new or worsening symptoms such as difficulty breathing, shortness of breath, chest pain, nausea, vomiting, slurred speech, facial droop, weakness in arms or legs, etc..Marland Kitchen

## 2019-10-04 DIAGNOSIS — E6609 Other obesity due to excess calories: Secondary | ICD-10-CM | POA: Diagnosis not present

## 2019-10-04 DIAGNOSIS — N63 Unspecified lump in unspecified breast: Secondary | ICD-10-CM | POA: Diagnosis not present

## 2019-10-04 DIAGNOSIS — R221 Localized swelling, mass and lump, neck: Secondary | ICD-10-CM | POA: Diagnosis not present

## 2019-10-04 DIAGNOSIS — Z1389 Encounter for screening for other disorder: Secondary | ICD-10-CM | POA: Diagnosis not present

## 2019-10-04 DIAGNOSIS — Z6834 Body mass index (BMI) 34.0-34.9, adult: Secondary | ICD-10-CM | POA: Diagnosis not present

## 2020-01-20 ENCOUNTER — Encounter: Payer: Self-pay | Admitting: Genetic Counselor

## 2020-02-20 ENCOUNTER — Other Ambulatory Visit: Payer: Self-pay | Admitting: Family Medicine

## 2020-02-20 DIAGNOSIS — Z1231 Encounter for screening mammogram for malignant neoplasm of breast: Secondary | ICD-10-CM

## 2020-02-21 ENCOUNTER — Other Ambulatory Visit: Payer: Self-pay | Admitting: Family Medicine

## 2020-02-21 DIAGNOSIS — Z1231 Encounter for screening mammogram for malignant neoplasm of breast: Secondary | ICD-10-CM

## 2020-03-27 ENCOUNTER — Ambulatory Visit
Admission: RE | Admit: 2020-03-27 | Discharge: 2020-03-27 | Disposition: A | Discharge status: Home / Self Care | Payer: BC Managed Care – PPO | Source: Ambulatory Visit

## 2020-03-27 ENCOUNTER — Other Ambulatory Visit: Payer: Self-pay

## 2020-03-27 DIAGNOSIS — Z1231 Encounter for screening mammogram for malignant neoplasm of breast: Secondary | ICD-10-CM

## 2020-04-19 DIAGNOSIS — H52223 Regular astigmatism, bilateral: Secondary | ICD-10-CM | POA: Diagnosis not present

## 2020-04-19 DIAGNOSIS — H524 Presbyopia: Secondary | ICD-10-CM | POA: Diagnosis not present

## 2020-04-19 DIAGNOSIS — H5203 Hypermetropia, bilateral: Secondary | ICD-10-CM | POA: Diagnosis not present

## 2020-04-19 DIAGNOSIS — H5210 Myopia, unspecified eye: Secondary | ICD-10-CM | POA: Diagnosis not present

## 2020-05-29 DIAGNOSIS — L989 Disorder of the skin and subcutaneous tissue, unspecified: Secondary | ICD-10-CM | POA: Diagnosis not present

## 2020-05-29 DIAGNOSIS — L918 Other hypertrophic disorders of the skin: Secondary | ICD-10-CM | POA: Diagnosis not present

## 2020-05-29 DIAGNOSIS — Z6835 Body mass index (BMI) 35.0-35.9, adult: Secondary | ICD-10-CM | POA: Diagnosis not present

## 2020-05-29 DIAGNOSIS — E6609 Other obesity due to excess calories: Secondary | ICD-10-CM | POA: Diagnosis not present

## 2020-08-03 DIAGNOSIS — F419 Anxiety disorder, unspecified: Secondary | ICD-10-CM | POA: Diagnosis not present

## 2020-08-03 DIAGNOSIS — E6609 Other obesity due to excess calories: Secondary | ICD-10-CM | POA: Diagnosis not present

## 2020-08-03 DIAGNOSIS — Z6835 Body mass index (BMI) 35.0-35.9, adult: Secondary | ICD-10-CM | POA: Diagnosis not present

## 2020-08-03 DIAGNOSIS — Z1331 Encounter for screening for depression: Secondary | ICD-10-CM | POA: Diagnosis not present

## 2020-08-08 DIAGNOSIS — M9905 Segmental and somatic dysfunction of pelvic region: Secondary | ICD-10-CM | POA: Diagnosis not present

## 2020-08-08 DIAGNOSIS — M9903 Segmental and somatic dysfunction of lumbar region: Secondary | ICD-10-CM | POA: Diagnosis not present

## 2020-08-08 DIAGNOSIS — M7062 Trochanteric bursitis, left hip: Secondary | ICD-10-CM | POA: Diagnosis not present

## 2020-08-08 DIAGNOSIS — M5442 Lumbago with sciatica, left side: Secondary | ICD-10-CM | POA: Diagnosis not present

## 2020-08-10 DIAGNOSIS — M9905 Segmental and somatic dysfunction of pelvic region: Secondary | ICD-10-CM | POA: Diagnosis not present

## 2020-08-10 DIAGNOSIS — M7062 Trochanteric bursitis, left hip: Secondary | ICD-10-CM | POA: Diagnosis not present

## 2020-08-10 DIAGNOSIS — M5442 Lumbago with sciatica, left side: Secondary | ICD-10-CM | POA: Diagnosis not present

## 2020-08-10 DIAGNOSIS — M9903 Segmental and somatic dysfunction of lumbar region: Secondary | ICD-10-CM | POA: Diagnosis not present

## 2020-08-13 DIAGNOSIS — M5442 Lumbago with sciatica, left side: Secondary | ICD-10-CM | POA: Diagnosis not present

## 2020-08-13 DIAGNOSIS — M9903 Segmental and somatic dysfunction of lumbar region: Secondary | ICD-10-CM | POA: Diagnosis not present

## 2020-08-13 DIAGNOSIS — M9905 Segmental and somatic dysfunction of pelvic region: Secondary | ICD-10-CM | POA: Diagnosis not present

## 2020-08-13 DIAGNOSIS — M7062 Trochanteric bursitis, left hip: Secondary | ICD-10-CM | POA: Diagnosis not present

## 2020-08-15 DIAGNOSIS — M5442 Lumbago with sciatica, left side: Secondary | ICD-10-CM | POA: Diagnosis not present

## 2020-08-15 DIAGNOSIS — M9903 Segmental and somatic dysfunction of lumbar region: Secondary | ICD-10-CM | POA: Diagnosis not present

## 2020-08-15 DIAGNOSIS — M9905 Segmental and somatic dysfunction of pelvic region: Secondary | ICD-10-CM | POA: Diagnosis not present

## 2020-08-15 DIAGNOSIS — M7062 Trochanteric bursitis, left hip: Secondary | ICD-10-CM | POA: Diagnosis not present

## 2020-08-22 DIAGNOSIS — M7062 Trochanteric bursitis, left hip: Secondary | ICD-10-CM | POA: Diagnosis not present

## 2020-08-22 DIAGNOSIS — M9903 Segmental and somatic dysfunction of lumbar region: Secondary | ICD-10-CM | POA: Diagnosis not present

## 2020-08-22 DIAGNOSIS — M9905 Segmental and somatic dysfunction of pelvic region: Secondary | ICD-10-CM | POA: Diagnosis not present

## 2020-08-22 DIAGNOSIS — M5442 Lumbago with sciatica, left side: Secondary | ICD-10-CM | POA: Diagnosis not present

## 2020-09-13 DIAGNOSIS — K219 Gastro-esophageal reflux disease without esophagitis: Secondary | ICD-10-CM | POA: Diagnosis not present

## 2020-09-13 DIAGNOSIS — F419 Anxiety disorder, unspecified: Secondary | ICD-10-CM | POA: Diagnosis not present

## 2020-09-13 DIAGNOSIS — E7849 Other hyperlipidemia: Secondary | ICD-10-CM | POA: Diagnosis not present

## 2020-09-13 DIAGNOSIS — Z853 Personal history of malignant neoplasm of breast: Secondary | ICD-10-CM | POA: Diagnosis not present

## 2020-09-13 DIAGNOSIS — Z124 Encounter for screening for malignant neoplasm of cervix: Secondary | ICD-10-CM | POA: Diagnosis not present

## 2020-09-13 DIAGNOSIS — Z1151 Encounter for screening for human papillomavirus (HPV): Secondary | ICD-10-CM | POA: Diagnosis not present

## 2020-09-13 DIAGNOSIS — Z01411 Encounter for gynecological examination (general) (routine) with abnormal findings: Secondary | ICD-10-CM | POA: Diagnosis not present

## 2020-09-13 DIAGNOSIS — E6609 Other obesity due to excess calories: Secondary | ICD-10-CM | POA: Diagnosis not present

## 2020-09-13 DIAGNOSIS — R7309 Other abnormal glucose: Secondary | ICD-10-CM | POA: Diagnosis not present

## 2020-09-13 DIAGNOSIS — N029 Recurrent and persistent hematuria with unspecified morphologic changes: Secondary | ICD-10-CM | POA: Diagnosis not present

## 2020-09-13 DIAGNOSIS — Z6834 Body mass index (BMI) 34.0-34.9, adult: Secondary | ICD-10-CM | POA: Diagnosis not present

## 2020-09-13 DIAGNOSIS — J302 Other seasonal allergic rhinitis: Secondary | ICD-10-CM | POA: Diagnosis not present

## 2020-09-13 DIAGNOSIS — R000 Tachycardia, unspecified: Secondary | ICD-10-CM | POA: Diagnosis not present

## 2020-10-03 DIAGNOSIS — J302 Other seasonal allergic rhinitis: Secondary | ICD-10-CM | POA: Diagnosis not present

## 2021-02-18 ENCOUNTER — Other Ambulatory Visit: Payer: Self-pay

## 2021-02-18 ENCOUNTER — Ambulatory Visit (INDEPENDENT_AMBULATORY_CARE_PROVIDER_SITE_OTHER): Payer: BC Managed Care – PPO

## 2021-02-18 ENCOUNTER — Ambulatory Visit
Admission: RE | Admit: 2021-02-18 | Discharge: 2021-02-18 | Disposition: A | Discharge status: Home / Self Care | Payer: BC Managed Care – PPO | Source: Ambulatory Visit | Attending: Family Medicine | Admitting: Family Medicine

## 2021-02-18 VITALS — BP 154/85 | HR 94 | Temp 99.6°F | Resp 18

## 2021-02-18 DIAGNOSIS — M25511 Pain in right shoulder: Secondary | ICD-10-CM

## 2021-02-18 DIAGNOSIS — M898X1 Other specified disorders of bone, shoulder: Secondary | ICD-10-CM

## 2021-02-18 DIAGNOSIS — M19011 Primary osteoarthritis, right shoulder: Secondary | ICD-10-CM | POA: Diagnosis not present

## 2021-02-18 MED ORDER — NAPROXEN 500 MG PO TABS: 500.0000 mg | ORAL_TABLET | Freq: Two times a day (BID) | ORAL | 0 refills | Status: AC

## 2021-02-18 NOTE — ED Provider Notes (Signed)
[facility]    CSN: [id number] Arrival date & time: 02/18/21  1440      History   Chief Complaint No chief complaint on file.   HPI Melissa Nguyen is a 55 y.o. female.   HPI Patient with a history of BCA of right breast presents today with right scapula pain. No known injury. Pain present x 3 days. Taking ibuprofen a few times without relief of pain. Past Medical History:  Diagnosis Date   Breast cancer ([place]) 2008   breast cancer, 2008, xrt and chemo. Stage I right br   Migraine headache    Personal history of chemotherapy 2008   Personal history of radiation therapy 2008   Pyelonephritis     Patient Active Problem List   Diagnosis Date Noted   GERD (gastroesophageal reflux disease) 04/25/2013   Esophageal dysphagia 04/25/2013    Past Surgical History:  Procedure Laterality Date   BILATERAL SALPINGOOPHORECTOMY  04/2008   due to breast cancer   BREAST LUMPECTOMY Right 2008   malignant  chemo and radiation   BREAST SURGERY  2008   CHOLECYSTECTOMY  2006   COLONOSCOPY  07/04/2002   MNO:TRRNH internal and external hemorrhoids, otherwise normal rectum/Small polyp at 30 cm cold biopsied.  The remainder of colonic mucosa normal/ (hyperplastic polyp). Next TCS at age 78.   COLONOSCOPY N/A 03/13/2017   Procedure: COLONOSCOPY;  Surgeon: Daneil Dolin, MD;  [facility];  Service: Endoscopy;  Laterality: N/A;  7:30   ESOPHAGOGASTRODUODENOSCOPY (EGD) WITH ESOPHAGEAL DILATION N/A 05/18/2013   Procedure: ESOPHAGOGASTRODUODENOSCOPY (EGD) WITH ESOPHAGEAL DILATION;  Surgeon: Daneil Dolin, MD;  [facility];  Service: Endoscopy;  Laterality: N/A;  10:45AM   lymph nodes  2009   with port-a-cath    OB History   No obstetric history on file.      Home Medications    Prior to Admission medications   Medication Sig Start Date End Date Taking? Authorizing Provider  naproxen (NAPROSYN) 500 MG tablet Take 1 tablet (500 mg total) by mouth 2  (two) times daily. 02/18/21  Yes Scot Jun, FNP  acetaminophen (TYLENOL) 500 MG tablet Take 1,000 mg by mouth every 8 (eight) hours as needed for mild pain or headache.    [provider]  clobetasol ointment (TEMOVATE) 6.57 % Apply 1 application topically daily.    [provider]  Multiple Vitamin (MULTIVITAMIN WITH MINERALS) TABS tablet Take 1 tablet by mouth daily.    [provider]  Na Sulfate-K Sulfate-Mg Sulf (SUPREP BOWEL PREP KIT) 17.5-3.13-1.6 GM/180ML SOLN Take 1 kit by mouth as directed. Patient taking differently: Take 1 kit by mouth as directed. Will do prior to procedure 02/25/17   Rourk, Cristopher Estimable, MD  ranitidine (ZANTAC) 150 MG capsule Take 150 mg by mouth daily as needed for heartburn. Only as needed     [provider]    Family History Family History  Problem Relation Age of Onset   Colon cancer Maternal Grandmother        age 34   Hypertension Mother    Healthy Father    Inflammatory bowel disease Neg Hx     Social History Social History   Tobacco Use   Smoking status: Never   Smokeless tobacco: Never   Tobacco comments:    quit x 30 years ago  Substance Use Topics   Alcohol use: No   Drug use: No     Allergies   Hydrocodone  Review of Systems Review of Systems Pertinent negatives listed in HPI  Physical Exam Triage Vital Signs ED Triage Vitals  Enc Vitals Group     BP 02/18/21 1546 (!) 154/85     Pulse Rate 02/18/21 1546 94     Resp 02/18/21 1546 18     Temp 02/18/21 1546 99.6 F (37.6 C)     Temp Source 02/18/21 1546 Oral     SpO2 02/18/21 1546 95 %     Weight --      Height --      Head Circumference --      Peak Flow --      Pain Score 02/18/21 1548 7     Pain Loc --      Pain Edu? --      Excl. in [place]? --    No data found.  Updated Vital Signs BP (!) 154/85 (BP Location: Right Arm)   Pulse 94   Temp 99.6 F (37.6 C) (Oral)   Resp 18   SpO2 95%   Visual Acuity Right Eye  Distance:   Left Eye Distance:   Bilateral Distance:    Right Eye Near:   Left Eye Near:    Bilateral Near:     Physical Exam Constitutional:      Appearance: She is obese.  HENT:     Head: Normocephalic.  Eyes:     Extraocular Movements: Extraocular movements intact.     Pupils: Pupils are equal, round, and reactive to light.  Cardiovascular:     Rate and Rhythm: Normal rate and regular rhythm.  Pulmonary:     Effort: Pulmonary effort is normal.     Breath sounds: Normal breath sounds.  Musculoskeletal:     Cervical back: Normal range of motion.  Skin:    General: Skin is warm.     Capillary Refill: Capillary refill takes less than 2 seconds.  Neurological:     General: No focal deficit present.     Mental Status: She is oriented to person, place, and time.  Psychiatric:        Mood and Affect: Mood normal.        Behavior: Behavior normal.        Thought Content: Thought content normal.        Judgment: Judgment normal.     UC Treatments / Results  Labs (all labs ordered are listed, but only abnormal results are displayed) Labs Reviewed  URINE CULTURE    EKG   Radiology DG Scapula Right  Result Date: 02/18/2021 CLINICAL DATA:  Right scapular pain for 2 days, initial encounter EXAM: RIGHT SCAPULA - 2+ VIEWS COMPARISON:  None. FINDINGS: Degenerative changes of the acromioclavicular joint are seen. No acute fracture or dislocation is noted. No soft tissue abnormality is seen. Underlying bony thorax is within normal limits. IMPRESSION: No acute abnormality noted. Electronically Signed   By: Inez Catalina M.D.   On: 02/18/2021 16:26    Procedures Procedures (including critical care time)  Medications Ordered in UC Medications - No data to display  Initial Impression / Assessment and Plan / UC Course  I have reviewed the triage vital signs and the nursing notes.  Pertinent labs & imaging results that were available during my care of the patient were reviewed by  me and considered in my medical decision making (see chart for details).    Imaging of the right scapula negative. Recommend conservative management with naproxen 500 mg twice daily. Apply heat  to scapula as needed. If symptoms persistent follow-up with orthopedics. Final Clinical Impressions(s) / UC Diagnoses   Final diagnoses:  Pain of right scapula     Discharge Instructions      Recommend naproxen 500 mg twice daily as needed for scapula pain. Apply heat as needed. Follow-up with orthopedic if pain persists following treatment.    ED Prescriptions     Medication Sig Dispense Auth. Provider   naproxen (NAPROSYN) 500 MG tablet Take 1 tablet (500 mg total) by mouth 2 (two) times daily. 30 tablet Scot Jun, FNP      PDMP not reviewed this encounter.   Scot Jun, [place] 02/21/21 [phone]

## 2021-02-18 NOTE — Discharge Instructions (Addendum)
Recommend naproxen 500 mg twice daily as needed for scapula pain. Apply heat as needed. Follow-up with orthopedic if pain persists following treatment.

## 2021-02-18 NOTE — ED Triage Notes (Addendum)
Stabbing pain in RT shoulder blade since Sunday morning. Denies any injury.  Pt had breast cancer 14 years ago and has some numbness near this area after radiation.  Pain is worse with moving and deep breathing.

## 2021-03-04 ENCOUNTER — Other Ambulatory Visit: Payer: Self-pay | Admitting: Family Medicine

## 2021-03-04 DIAGNOSIS — Z1231 Encounter for screening mammogram for malignant neoplasm of breast: Secondary | ICD-10-CM

## 2021-04-05 ENCOUNTER — Ambulatory Visit
Admission: RE | Admit: 2021-04-05 | Discharge: 2021-04-05 | Disposition: A | Discharge status: Home / Self Care | Payer: BC Managed Care – PPO | Source: Ambulatory Visit | Attending: Family Medicine | Admitting: Family Medicine

## 2021-04-05 ENCOUNTER — Other Ambulatory Visit: Payer: Self-pay

## 2021-04-05 DIAGNOSIS — Z1231 Encounter for screening mammogram for malignant neoplasm of breast: Secondary | ICD-10-CM | POA: Diagnosis not present

## 2021-04-12 ENCOUNTER — Ambulatory Visit: Payer: BC Managed Care – PPO

## 2021-04-12 ENCOUNTER — Encounter: Payer: Self-pay | Admitting: Emergency Medicine

## 2021-04-12 ENCOUNTER — Other Ambulatory Visit: Payer: Self-pay

## 2021-04-12 ENCOUNTER — Ambulatory Visit
Admission: EM | Admit: 2021-04-12 | Discharge: 2021-04-12 | Disposition: A | Discharge status: Home / Self Care | Payer: BC Managed Care – PPO

## 2021-04-12 DIAGNOSIS — Z20822 Contact with and (suspected) exposure to covid-19: Secondary | ICD-10-CM | POA: Diagnosis not present

## 2021-04-12 DIAGNOSIS — J069 Acute upper respiratory infection, unspecified: Secondary | ICD-10-CM

## 2021-04-12 MED ORDER — PROMETHAZINE-DM 6.25-15 MG/5ML PO SYRP: 5.0000 mL | ORAL_SOLUTION | Freq: Four times a day (QID) | ORAL | 0 refills | Status: AC | PRN

## 2021-04-12 MED ORDER — FLUTICASONE PROPIONATE 50 MCG/ACT NA SUSP: 1.0000 | Freq: Two times a day (BID) | NASAL | 0 refills | Status: AC

## 2021-04-12 NOTE — ED Provider Notes (Signed)
[facility]    CSN: [id number] Arrival date & time: 04/12/21  1241      History   Chief Complaint No chief complaint on file.   HPI Melissa Nguyen is a 55 y.o. female.   Patient presenting today with 2-day history of nasal congestion, sinus pressure, sinus headache, dry cough.  Denies fever, chills, body aches, chest pain, shortness of breath, abdominal pain, nausea vomiting or diarrhea.  Taking Mucinex and over-the-counter cough syrups with mild temporary relief.  No known chronic pulmonary disease.  Son sick with similar symptoms.   Past Medical History:  Diagnosis Date   Breast cancer ([place]) 2008   breast cancer, 2008, xrt and chemo. Stage I right br   Migraine headache    Personal history of chemotherapy 2008   Personal history of radiation therapy 2008   Pyelonephritis     Patient Active Problem List   Diagnosis Date Noted   GERD (gastroesophageal reflux disease) 04/25/2013   Esophageal dysphagia 04/25/2013    Past Surgical History:  Procedure Laterality Date   BILATERAL SALPINGOOPHORECTOMY  04/2008   due to breast cancer   BREAST LUMPECTOMY Right 2008   malignant  chemo and radiation   BREAST SURGERY  2008   CHOLECYSTECTOMY  2006   COLONOSCOPY  07/04/2002   JKK:XFGHW internal and external hemorrhoids, otherwise normal rectum/Small polyp at 30 cm cold biopsied.  The remainder of colonic mucosa normal/ (hyperplastic polyp). Next TCS at age 89.   COLONOSCOPY N/A 03/13/2017   Procedure: COLONOSCOPY;  Surgeon: Daneil Dolin, MD;  [facility];  Service: Endoscopy;  Laterality: N/A;  7:30   ESOPHAGOGASTRODUODENOSCOPY (EGD) WITH ESOPHAGEAL DILATION N/A 05/18/2013   Procedure: ESOPHAGOGASTRODUODENOSCOPY (EGD) WITH ESOPHAGEAL DILATION;  Surgeon: Daneil Dolin, MD;  [facility];  Service: Endoscopy;  Laterality: N/A;  10:45AM   lymph nodes  2009   with port-a-cath    OB History   No obstetric history on file.      Home  Medications    Prior to Admission medications   Medication Sig Start Date End Date Taking? Authorizing Provider  escitalopram (LEXAPRO) 10 MG tablet Take 10 mg by mouth daily.   Yes [provider]  fluticasone (FLONASE) 50 MCG/ACT nasal spray Place 1 spray into both nostrils 2 (two) times daily. 04/12/21  Yes Volney American, PA-C  promethazine-dextromethorphan (PROMETHAZINE-DM) 6.25-15 MG/5ML syrup Take 5 mLs by mouth 4 (four) times daily as needed for cough. 04/12/21  Yes Volney American, PA-C  rosuvastatin (CRESTOR) 10 MG tablet Take 10 mg by mouth daily.   Yes [provider]  acetaminophen (TYLENOL) 500 MG tablet Take 1,000 mg by mouth every 8 (eight) hours as needed for mild pain or headache.    [provider]  clobetasol ointment (TEMOVATE) 2.99 % Apply 1 application topically daily.    [provider]  Multiple Vitamin (MULTIVITAMIN WITH MINERALS) TABS tablet Take 1 tablet by mouth daily.    [provider]  Na Sulfate-K Sulfate-Mg Sulf (SUPREP BOWEL PREP KIT) 17.5-3.13-1.6 GM/180ML SOLN Take 1 kit by mouth as directed. Patient taking differently: Take 1 kit by mouth as directed. Will do prior to procedure 02/25/17   Rourk, Cristopher Estimable, MD  naproxen (NAPROSYN) 500 MG tablet Take 1 tablet (500 mg total) by mouth 2 (two) times daily. 02/18/21   Scot Jun, FNP  ranitidine (ZANTAC) 150 MG capsule Take 150 mg by mouth daily as needed for heartburn. Only as  needed     [provider]    Family History Family History  Problem Relation Age of Onset   Colon cancer Maternal Grandmother        age 69   Hypertension Mother    Healthy Father    Inflammatory bowel disease Neg Hx     Social History Social History   Tobacco Use   Smoking status: Never   Smokeless tobacco: Never   Tobacco comments:    quit x 30 years ago  Substance Use Topics   Alcohol use: No   Drug use: No     Allergies    Hydrocodone   Review of Systems Review of Systems Per HPI  Physical Exam Triage Vital Signs ED Triage Vitals  Enc Vitals Group     BP 04/12/21 1328 128/84     Pulse Rate 04/12/21 1328 (!) 112     Resp 04/12/21 1328 18     Temp 04/12/21 1328 99.1 F (37.3 C)     Temp Source 04/12/21 1328 Oral     SpO2 04/12/21 1328 95 %     Weight --      Height --      Head Circumference --      Peak Flow --      Pain Score 04/12/21 1330 1     Pain Loc --      Pain Edu? --      Excl. in [place]? --    No data found.  Updated Vital Signs BP 128/84 (BP Location: Left Arm)   Pulse (!) 112   Temp 99.1 F (37.3 C) (Oral)   Resp 18   SpO2 95%   Visual Acuity Right Eye Distance:   Left Eye Distance:   Bilateral Distance:    Right Eye Near:   Left Eye Near:    Bilateral Near:     Physical Exam Vitals and nursing note reviewed.  Constitutional:      Appearance: Normal appearance. She is not ill-appearing.  HENT:     Head: Atraumatic.     Right Ear: Tympanic membrane normal.     Left Ear: Tympanic membrane normal.     Nose: Rhinorrhea present.     Mouth/Throat:     Mouth: Mucous membranes are moist.     Pharynx: Oropharynx is clear. Posterior oropharyngeal erythema present.  Eyes:     Extraocular Movements: Extraocular movements intact.     Conjunctiva/sclera: Conjunctivae normal.  Cardiovascular:     Rate and Rhythm: Normal rate and regular rhythm.     Heart sounds: Normal heart sounds.  Pulmonary:     Effort: Pulmonary effort is normal.     Breath sounds: Normal breath sounds. No wheezing or rales.  Musculoskeletal:        General: Normal range of motion.     Cervical back: Normal range of motion and neck supple.  Skin:    General: Skin is warm and dry.  Neurological:     Mental Status: She is alert and oriented to person, place, and time.  Psychiatric:        Mood and Affect: Mood normal.        Thought Content: Thought content normal.        Judgment: Judgment  normal.     UC Treatments / Results  Labs (all labs ordered are listed, but only abnormal results are displayed) Labs Reviewed  COVID-19, FLU A+B NAA    EKG   Radiology No results found.  Procedures Procedures (including critical care time)  Medications Ordered in UC Medications - No data to display  Initial Impression / Assessment and Plan / UC Course  I have reviewed the triage vital signs and the nursing notes.  Pertinent labs & imaging results that were available during my care of the patient were reviewed by me and considered in my medical decision making (see chart for details).     Mildly tachycardic in triage, otherwise vital signs within normal limits.  Exam overall reassuring, suspect viral illness causing symptoms.  COVID and flu testing pending, treat with Flonase, Phenergan DM for cough.  Discussed supportive over-the-counter medications and home care.  Work note given.  Return for acutely worsening symptoms.  Final Clinical Impressions(s) / UC Diagnoses   Final diagnoses:  Exposure to COVID-19 virus  Viral URI with cough   Discharge Instructions   None    ED Prescriptions     Medication Sig Dispense Auth. Provider   fluticasone (FLONASE) 50 MCG/ACT nasal spray Place 1 spray into both nostrils 2 (two) times daily. [place], [place]   promethazine-dextromethorphan (PROMETHAZINE-DM) 6.25-15 MG/5ML syrup Take 5 mLs by mouth 4 (four) times daily as needed for cough. 100 mL Volney American, [place]      PDMP not reviewed this encounter.   Volney American, [place] 04/12/21 1346

## 2021-04-12 NOTE — ED Triage Notes (Signed)
Chest and nasal congestion started on Wednesday.  Headache and head pressure dry cough.  Has been taking mucinex with out relief.

## 2021-04-13 LAB — COVID-19, FLU A+B NAA
Influenza A, NAA: NOT DETECTED
Influenza B, NAA: NOT DETECTED
SARS-CoV-2, NAA: DETECTED — AB

## 2021-04-14 ENCOUNTER — Telehealth: Payer: Self-pay | Admitting: *Deleted

## 2021-04-14 NOTE — Telephone Encounter (Signed)
Returned pt's phone call RE: test results. Confirmed with pt that she indeed tested positive for Covid.  Instructed she may continue the prescribed meds for symptomatic relief.  Discussed isolation x 5 days from sx onset, followed by 5 days of masking when around others.  Instructed to be seen for any significant worsening sxs.  Pt verbalized understanding of all.

## 2021-05-09 DIAGNOSIS — Z6834 Body mass index (BMI) 34.0-34.9, adult: Secondary | ICD-10-CM | POA: Diagnosis not present

## 2021-05-09 DIAGNOSIS — J101 Influenza due to other identified influenza virus with other respiratory manifestations: Secondary | ICD-10-CM | POA: Diagnosis not present

## 2021-05-09 DIAGNOSIS — E6609 Other obesity due to excess calories: Secondary | ICD-10-CM | POA: Diagnosis not present

## 2021-08-04 ENCOUNTER — Encounter (HOSPITAL_COMMUNITY): Payer: Self-pay | Admitting: Emergency Medicine

## 2021-08-04 ENCOUNTER — Emergency Department (HOSPITAL_COMMUNITY): Payer: BC Managed Care – PPO

## 2021-08-04 ENCOUNTER — Emergency Department (HOSPITAL_COMMUNITY)
Admission: EM | Admit: 2021-08-04 | Discharge: 2021-08-04 | Disposition: A | Discharge status: Home / Self Care | Payer: BC Managed Care – PPO | Attending: Emergency Medicine | Admitting: Emergency Medicine

## 2021-08-04 ENCOUNTER — Other Ambulatory Visit: Payer: Self-pay

## 2021-08-04 DIAGNOSIS — R42 Dizziness and giddiness: Secondary | ICD-10-CM | POA: Diagnosis not present

## 2021-08-04 DIAGNOSIS — Z853 Personal history of malignant neoplasm of breast: Secondary | ICD-10-CM | POA: Diagnosis not present

## 2021-08-04 DIAGNOSIS — R0602 Shortness of breath: Secondary | ICD-10-CM | POA: Diagnosis not present

## 2021-08-04 DIAGNOSIS — R002 Palpitations: Secondary | ICD-10-CM | POA: Insufficient documentation

## 2021-08-04 DIAGNOSIS — R000 Tachycardia, unspecified: Secondary | ICD-10-CM | POA: Diagnosis not present

## 2021-08-04 LAB — CBC
HCT: 38.4 % (ref 36.0–46.0)
Hemoglobin: 12.9 g/dL (ref 12.0–15.0)
MCH: 28.8 pg (ref 26.0–34.0)
MCHC: 33.6 g/dL (ref 30.0–36.0)
MCV: 85.7 fL (ref 80.0–100.0)
Platelets: 234 10*3/uL (ref 150–400)
RBC: 4.48 MIL/uL (ref 3.87–5.11)
RDW: 13.5 % (ref 11.5–15.5)
WBC: 9.6 10*3/uL (ref 4.0–10.5)
nRBC: 0 % (ref 0.0–0.2)

## 2021-08-04 LAB — COMPREHENSIVE METABOLIC PANEL
ALT: 24 U/L (ref 0–44)
AST: 23 U/L (ref 15–41)
Albumin: 4.2 g/dL (ref 3.5–5.0)
Alkaline Phosphatase: 70 U/L (ref 38–126)
Anion gap: 9 (ref 5–15)
BUN: 16 mg/dL (ref 6–20)
CO2: 26 mmol/L (ref 22–32)
Calcium: 9.6 mg/dL (ref 8.9–10.3)
Chloride: 106 mmol/L (ref 98–111)
Creatinine, Ser: 0.74 mg/dL (ref 0.44–1.00)
GFR, Estimated: >60 mL/min (ref >60)
Glucose, Bld: 91 mg/dL (ref 70–99)
Potassium: 3.8 mmol/L (ref 3.5–5.1)
Sodium: 141 mmol/L (ref 135–145)
Total Bilirubin: 0.6 mg/dL (ref 0.3–1.2)
Total Protein: 7.5 g/dL (ref 6.5–8.1)

## 2021-08-04 LAB — TROPONIN I (HIGH SENSITIVITY)
Troponin I (High Sensitivity): 3 ng/L (ref <18)
Troponin I (High Sensitivity): 3 ng/L (ref <18)

## 2021-08-04 LAB — D-DIMER, QUANTITATIVE: D-Dimer, Quant: 0.38 ug/mL-FEU (ref 0.00–0.50)

## 2021-08-04 NOTE — Discharge Instructions (Signed)
The test today in the ER were reassuring.  Follow-up with your primary care doctor to be rechecked.  Consider an outpatient cardiac monitor if you have any recurrent symptoms.  Return as needed to the ED for persistent palpitations, shortness of breath, chest pain

## 2021-08-04 NOTE — ED Provider Notes (Signed)
[facility] EMERGENCY DEPARTMENT Provider Note   CSN: [id number] Arrival date & time: 08/04/21  1228     History  Chief Complaint  Patient presents with   Shortness of Breath    Melissa Nguyen is a 56 y.o. female.   Shortness of Breath Associated symptoms: no fever    Patient history a history of migraines, breast cancer who presents with an episode of shortness of breath and palpitations.  Patient states yesterday she was working a lot around the house.  She did not eat or drink like she largely does.  She had a couple episodes of starting to feel lightheaded and dizzy.  She ended up stopping and eating something and her symptoms improved.  While she was at church today she had sudden onset of heart racing.  She felt lightheaded and dizzy and short of breath.  The symptoms have all resolved at this point.  Patient states when she had the episode she checked her heart rate on her watch and it was reading at 111.  She denies any chest pain.  No leg swelling.  No fevers or chills.  No history of similar symptoms like this in the past  Home Medications Prior to Admission medications   Medication Sig Start Date End Date Taking? Authorizing Provider  acetaminophen (TYLENOL) 500 MG tablet Take 1,000 mg by mouth every 8 (eight) hours as needed for mild pain or headache.    [provider]  clobetasol ointment (TEMOVATE) 4.81 % Apply 1 application topically daily.    [provider]  escitalopram (LEXAPRO) 10 MG tablet Take 10 mg by mouth daily.    [provider]  fluticasone (FLONASE) 50 MCG/ACT nasal spray Place 1 spray into both nostrils 2 (two) times daily. 04/12/21   Volney American, PA-C  Multiple Vitamin (MULTIVITAMIN WITH MINERALS) TABS tablet Take 1 tablet by mouth daily.    [provider]  Na Sulfate-K Sulfate-Mg Sulf (SUPREP BOWEL PREP KIT) 17.5-3.13-1.6 GM/180ML SOLN Take 1 kit by mouth as directed. Patient taking differently: Take 1  kit by mouth as directed. Will do prior to procedure 02/25/17   Rourk, Cristopher Estimable, MD  naproxen (NAPROSYN) 500 MG tablet Take 1 tablet (500 mg total) by mouth 2 (two) times daily. 02/18/21   Scot Jun, FNP  promethazine-dextromethorphan (PROMETHAZINE-DM) 6.25-15 MG/5ML syrup Take 5 mLs by mouth 4 (four) times daily as needed for cough. 04/12/21   Volney American, PA-C  ranitidine (ZANTAC) 150 MG capsule Take 150 mg by mouth daily as needed for heartburn. Only as needed     [provider]  rosuvastatin (CRESTOR) 10 MG tablet Take 10 mg by mouth daily.    [provider]      Allergies    Hydrocodone    Review of Systems   Review of Systems  Constitutional:  Negative for fever.  Respiratory:  Positive for shortness of breath.    Physical Exam Updated Vital Signs BP 139/79    Pulse 83    Temp 98.5 F (36.9 C) (Oral)    Resp 18    Ht 1.702 m (5' 7")    Wt 101.6 kg    SpO2 97%    BMI 35.08 kg/m  Physical Exam Vitals and nursing note reviewed.  Constitutional:      General: She is not in acute distress.    Appearance: She is well-developed.  HENT:     Head: Normocephalic and atraumatic.  Right Ear: External ear normal.     Left Ear: External ear normal.  Eyes:     General: No scleral icterus.       Right eye: No discharge.        Left eye: No discharge.     Conjunctiva/sclera: Conjunctivae normal.  Neck:     Trachea: No tracheal deviation.  Cardiovascular:     Rate and Rhythm: Normal rate and regular rhythm.  Pulmonary:     Effort: Pulmonary effort is normal. No respiratory distress.     Breath sounds: Normal breath sounds. No stridor. No wheezing or rales.  Abdominal:     General: Bowel sounds are normal. There is no distension.     Palpations: Abdomen is soft.     Tenderness: There is no abdominal tenderness. There is no guarding or rebound.  Musculoskeletal:        General: No tenderness or deformity.     Cervical back: Neck supple.   Skin:    General: Skin is warm and dry.     Findings: No rash.  Neurological:     General: No focal deficit present.     Mental Status: She is alert.     Cranial Nerves: No cranial nerve deficit (no facial droop, extraocular movements intact, no slurred speech).     Sensory: No sensory deficit.     Motor: No abnormal muscle tone or seizure activity.     Coordination: Coordination normal.  Psychiatric:        Mood and Affect: Mood normal.    ED Results / Procedures / Treatments   Labs (all labs ordered are listed, but only abnormal results are displayed) Labs Reviewed  CBC  COMPREHENSIVE METABOLIC PANEL  D-DIMER, QUANTITATIVE  TROPONIN I (HIGH SENSITIVITY)  TROPONIN I (HIGH SENSITIVITY)    EKG None  Radiology DG Chest 2 View  Result Date: 08/04/2021 CLINICAL DATA:  Shortness of breath and dizziness beginning yesterday. Tachycardia. EXAM: CHEST - 2 VIEW COMPARISON:  01/06/2014 FINDINGS: The heart size and mediastinal contours are within normal limits. Mild aortic atherosclerotic calcification noted. both lungs are clear. The visualized skeletal structures are unremarkable. IMPRESSION: No active cardiopulmonary disease. Electronically Signed   By: Marlaine Hind M.D.   On: 08/04/2021 14:35    Procedures Procedures    Medications Ordered in ED Medications - No data to display  ED Course/ Medical Decision Making/ A&P Clinical Course as of 08/04/21 1750  Sun Aug 04, 2021  1522 CBC Normal [JK]  1522 Comprehensive metabolic panel Normal [JK]  1522 Troponin I (High Sensitivity) Normal [JK]  1522 D-dimer, quantitative Normal [JK]  1522 DG Chest 2 View Chest x-ray images and radiology report reviewed.  No acute abnormality noted. [JK]  1740 Troponin I (High Sensitivity) Second troponin normal [JK]    Clinical Course User Index [JK] Dorie Rank, MD           HEART Score: 2                Medical Decision Making Amount and/or Complexity of Data Reviewed Labs: ordered.  Decision-making details documented in ED Course. Radiology: ordered. Decision-making details documented in ED Course.   Patient presented to the ED for evaluation of palpitations.  Patient was at church and became short of breath lightheaded and felt her heart racing.  She became very lightheaded and had an episode of chest tightness.  Symptoms have all resolved.  Low risk heart score of 2.  Serial troponins normal.  Doubt symptoms related to ACS.  Low risk for PE and D-dimer is negative.  I doubt pulmonary embolism.  No signs of pneumonia or pneumo thorax.  Symptoms have all resolved at this time.  No dysrhythmia noted on her EKG.  Is possible this could have been related to stress and anxiety but patient may benefit from outpatient cardiac monitoring if she has any recurrent symptoms.  Evaluation and diagnostic testing in the emergency department does not suggest an emergent condition requiring admission or immediate intervention beyond what has been performed at this time.  The patient is safe for discharge and has been instructed to return immediately for worsening symptoms, change in symptoms or any other concerns.           Final Clinical Impression(s) / ED Diagnoses Final diagnoses:  Palpitation    Rx / DC Orders ED Discharge Orders     None         Dorie Rank, MD 08/04/21 1751

## 2021-08-04 NOTE — ED Triage Notes (Signed)
Patient c/o shortness of breath and dizziness with standing. Patient states she now feels like heart is racing. Per patient she checked HR earlier with symptoms and was 111. Patient states symptoms originally started yesterday in which she thought her blood sugar might be dropping and she may be dehydrated so she ate and drank fluid in which she felt better for "a couple of hours" before symptoms returned. Patient states symptoms progressively got worse today at church when she stood to go sing at the podium at church.

## 2021-08-04 NOTE — ED Provider Triage Note (Signed)
Emergency Medicine Provider Triage Evaluation Note  Melissa Nguyen , a 56 y.o. female  was evaluated in triage.  Pt complains of shortness of breath with exertion and palpitations started approximately yesterday.  Patient that she was working out in the yard and became short of breath.  She can think anything of it as she thought that she had not really eaten much that day.  Symptoms returned the evening and while she was at church this morning.  She got very dizzy as well.  Also endorses some chest tightness as well.  Patient does have a remote history of COVID and breast cancer. No fever, chills, or abdominal symptoms.   Review of Systems  Positive:  Negative: See above   Physical Exam  BP (!) 162/85 (BP Location: Right Arm)    Pulse 98    Temp 98.5 F (36.9 C) (Oral)    Resp (!) 21    Ht 5\' 7"  (1.702 m)    Wt 101.6 kg    SpO2 98%    BMI 35.08 kg/m  Gen:   Awake, no distress   Resp:  Normal effort, clear to auscultation bilaterally MSK:   Moves extremities without difficulty  Other:    Medical Decision Making  Medically screening exam initiated at 2:23 PM.  Appropriate orders placed.  Melissa Nguyen was informed that the remainder of the evaluation will be completed by another provider, this initial triage assessment does not replace that evaluation, and the importance of remaining in the ED until their evaluation is complete.     Myna Bright [place], [place] 08/04/21 1424

## 2021-10-14 DIAGNOSIS — H5210 Myopia, unspecified eye: Secondary | ICD-10-CM | POA: Diagnosis not present

## 2021-10-14 DIAGNOSIS — H5213 Myopia, bilateral: Secondary | ICD-10-CM | POA: Diagnosis not present

## 2021-10-29 DIAGNOSIS — Z6834 Body mass index (BMI) 34.0-34.9, adult: Secondary | ICD-10-CM | POA: Diagnosis not present

## 2021-10-29 DIAGNOSIS — E782 Mixed hyperlipidemia: Secondary | ICD-10-CM | POA: Diagnosis not present

## 2021-10-29 DIAGNOSIS — F419 Anxiety disorder, unspecified: Secondary | ICD-10-CM | POA: Diagnosis not present

## 2021-10-29 DIAGNOSIS — E6609 Other obesity due to excess calories: Secondary | ICD-10-CM | POA: Diagnosis not present

## 2021-10-29 DIAGNOSIS — E7849 Other hyperlipidemia: Secondary | ICD-10-CM | POA: Diagnosis not present

## 2021-11-29 DIAGNOSIS — M25552 Pain in left hip: Secondary | ICD-10-CM | POA: Diagnosis not present

## 2021-11-29 DIAGNOSIS — M545 Low back pain, unspecified: Secondary | ICD-10-CM | POA: Diagnosis not present

## 2022-02-01 ENCOUNTER — Ambulatory Visit: Payer: BC Managed Care – PPO

## 2022-02-06 ENCOUNTER — Other Ambulatory Visit: Payer: Self-pay | Admitting: Family Medicine

## 2022-02-06 DIAGNOSIS — Z1231 Encounter for screening mammogram for malignant neoplasm of breast: Secondary | ICD-10-CM

## 2022-02-14 DIAGNOSIS — Z6834 Body mass index (BMI) 34.0-34.9, adult: Secondary | ICD-10-CM | POA: Diagnosis not present

## 2022-02-14 DIAGNOSIS — M5136 Other intervertebral disc degeneration, lumbar region: Secondary | ICD-10-CM | POA: Diagnosis not present

## 2022-02-26 ENCOUNTER — Encounter: Payer: Self-pay | Admitting: *Deleted

## 2022-03-28 ENCOUNTER — Ambulatory Visit
Admission: RE | Admit: 2022-03-28 | Discharge: 2022-03-28 | Disposition: A | Discharge status: Home / Self Care | Payer: BC Managed Care – PPO | Source: Ambulatory Visit | Attending: Family Medicine | Admitting: Family Medicine

## 2022-03-28 VITALS — BP 159/80 | HR 98 | Temp 98.8°F | Resp 18

## 2022-03-28 DIAGNOSIS — Z79899 Other long term (current) drug therapy: Secondary | ICD-10-CM | POA: Insufficient documentation

## 2022-03-28 DIAGNOSIS — Z7952 Long term (current) use of systemic steroids: Secondary | ICD-10-CM | POA: Diagnosis not present

## 2022-03-28 DIAGNOSIS — Z1152 Encounter for screening for COVID-19: Secondary | ICD-10-CM | POA: Diagnosis not present

## 2022-03-28 DIAGNOSIS — J069 Acute upper respiratory infection, unspecified: Secondary | ICD-10-CM | POA: Insufficient documentation

## 2022-03-28 DIAGNOSIS — R059 Cough, unspecified: Secondary | ICD-10-CM | POA: Insufficient documentation

## 2022-03-28 LAB — RESP PANEL BY RT-PCR (FLU A&B, COVID) ARPGX2
Influenza A by PCR: NEGATIVE
Influenza B by PCR: NEGATIVE
SARS Coronavirus 2 by RT PCR: NEGATIVE

## 2022-03-28 MED ORDER — FLUTICASONE PROPIONATE 50 MCG/ACT NA SUSP: 1.0000 | Freq: Two times a day (BID) | NASAL | 2 refills | Status: AC

## 2022-03-28 MED ORDER — PREDNISONE 20 MG PO TABS: 40.0000 mg | ORAL_TABLET | Freq: Every day | ORAL | 0 refills | Status: AC

## 2022-03-28 NOTE — ED Provider Notes (Signed)
[facility]    CSN: [id number] Arrival date & time: 03/28/22  1643      History   Chief Complaint Chief Complaint  Patient presents with   Nasal Congestion    Entered by patient   Appointment    [facility]    HPI Melissa Nguyen is a 56 y.o. female.   Pt reports left era feels sight, , nasal congestion  since last night, drainage in throat. Reports she took 1 dose of coricidin and makes her sleepy.       Past Medical History:  Diagnosis Date   Breast cancer ([place]) 2008   breast cancer, 2008, xrt and chemo. Stage I right br   Migraine headache    Personal history of chemotherapy 2008   Personal history of radiation therapy 2008   Pyelonephritis     Patient Active Problem List   Diagnosis Date Noted   GERD (gastroesophageal reflux disease) 04/25/2013   Esophageal dysphagia 04/25/2013    Past Surgical History:  Procedure Laterality Date   BILATERAL SALPINGOOPHORECTOMY  04/2008   due to breast cancer   BREAST LUMPECTOMY Right 2008   malignant  chemo and radiation   BREAST SURGERY  2008   CHOLECYSTECTOMY  2006   COLONOSCOPY  07/04/2002   NWG:NFAOZ internal and external hemorrhoids, otherwise normal rectum/Small polyp at 30 cm cold biopsied.  The remainder of colonic mucosa normal/ (hyperplastic polyp). Next TCS at age 40.   COLONOSCOPY N/A 03/13/2017   Procedure: COLONOSCOPY;  Surgeon: Daneil Dolin, MD;  Location: [facility];  Service: Endoscopy;  Laterality: N/A;  7:30   ESOPHAGOGASTRODUODENOSCOPY (EGD) WITH ESOPHAGEAL DILATION N/A 05/18/2013   Procedure: ESOPHAGOGASTRODUODENOSCOPY (EGD) WITH ESOPHAGEAL DILATION;  Surgeon: Daneil Dolin, MD;  Location: [facility];  Service: Endoscopy;  Laterality: N/A;  10:45AM   lymph nodes  2009   with port-a-cath    OB History     Gravida  1   Para  1   Term  1   Preterm      AB      Living  1      SAB      IAB      Ectopic      Multiple      Live Births               Home  Medications    Prior to Admission medications   Medication Sig Start Date End Date Taking? Authorizing Provider  celecoxib (CELEBREX) 200 MG capsule Take 200 mg by mouth 2 (two) times daily.   Yes [provider]  fluticasone (FLONASE) 50 MCG/ACT nasal spray Place 1 spray into both nostrils 2 (two) times daily. 03/28/22  Yes Volney American, PA-C  predniSONE (DELTASONE) 20 MG tablet Take 2 tablets (40 mg total) by mouth daily with breakfast. 03/28/22  Yes Volney American, PA-C  acetaminophen (TYLENOL) 500 MG tablet Take 1,000 mg by mouth every 8 (eight) hours as needed for mild pain or headache.    [provider]  clobetasol ointment (TEMOVATE) 3.08 % Apply 1 application topically daily.    [provider]  escitalopram (LEXAPRO) 10 MG tablet Take 10 mg by mouth daily.    [provider]  fluticasone (FLONASE) 50 MCG/ACT nasal spray Place 1 spray into both nostrils 2 (two) times daily. 04/12/21   Volney American, PA-C  Multiple Vitamin (MULTIVITAMIN WITH MINERALS) TABS tablet Take 1 tablet by mouth daily.  [provider]  Na Sulfate-K Sulfate-Mg Sulf (SUPREP BOWEL PREP KIT) 17.5-3.13-1.6 GM/180ML SOLN Take 1 kit by mouth as directed. Patient taking differently: Take 1 kit by mouth as directed. Will do prior to procedure 02/25/17   Rourk, Cristopher Estimable, MD  naproxen (NAPROSYN) 500 MG tablet Take 1 tablet (500 mg total) by mouth 2 (two) times daily. 02/18/21   Scot Jun, FNP  promethazine-dextromethorphan (PROMETHAZINE-DM) 6.25-15 MG/5ML syrup Take 5 mLs by mouth 4 (four) times daily as needed for cough. 04/12/21   Volney American, PA-C  ranitidine (ZANTAC) 150 MG capsule Take 150 mg by mouth daily as needed for heartburn. Only as needed     [provider]  rosuvastatin (CRESTOR) 10 MG tablet Take 10 mg by mouth daily.    [provider]    Family History Family History  Problem Relation Age of Onset    Colon cancer Maternal Grandmother        age 35   Hypertension Mother    Healthy Father    Inflammatory bowel disease Neg Hx     Social History Social History   Tobacco Use   Smoking status: Never   Smokeless tobacco: Never   Tobacco comments:    quit x 30 years ago  Vaping Use   Vaping Use: Never used  Substance Use Topics   Alcohol use: No   Drug use: No     Allergies   Hydrocodone   Review of Systems Review of Systems PER HPI  Physical Exam Triage Vital Signs ED Triage Vitals [03/28/22 1649]  Enc Vitals Group     BP (!) 159/80     Pulse Rate 98     Resp 18     Temp 98.8 F (37.1 C)     Temp Source Oral     SpO2 93 %     Weight      Height      Head Circumference      Peak Flow      Pain Score 0     Pain Loc      Pain Edu?      Excl. in [place]?    No data found.  Updated Vital Signs BP (!) 159/80 (BP Location: Right Arm)   Pulse 98   Temp 98.8 F (37.1 C) (Oral)   Resp 18   SpO2 93%   Visual Acuity Right Eye Distance:   Left Eye Distance:   Bilateral Distance:    Right Eye Near:   Left Eye Near:    Bilateral Near:     Physical Exam Vitals and nursing note reviewed.  Constitutional:      Appearance: Normal appearance.  HENT:     Head: Atraumatic.     Right Ear: Tympanic membrane and external ear normal.     Left Ear: External ear normal.     Ears:     Comments: Left middle ear effusion    Nose: Rhinorrhea present.     Mouth/Throat:     Mouth: Mucous membranes are moist.     Pharynx: Posterior oropharyngeal erythema present.  Eyes:     Extraocular Movements: Extraocular movements intact.     Conjunctiva/sclera: Conjunctivae normal.  Cardiovascular:     Rate and Rhythm: Normal rate and regular rhythm.     Heart sounds: Normal heart sounds.  Pulmonary:     Effort: Pulmonary effort is normal.     Breath sounds: Normal breath sounds. No wheezing or rales.  Musculoskeletal:        General: Normal range of motion.     Cervical  back: Normal range of motion and neck supple.  Skin:    General: Skin is warm and dry.  Neurological:     Mental Status: She is alert and oriented to person, place, and time.  Psychiatric:        Mood and Affect: Mood normal.        Thought Content: Thought content normal.    UC Treatments / Results  Labs (all labs ordered are listed, but only abnormal results are displayed) Labs Reviewed  RESP PANEL BY RT-PCR (FLU A&B, COVID) [id number]   EKG   Radiology No results found.  Procedures Procedures (including critical care time)  Medications Ordered in UC Medications - No data to display  Initial Impression / Assessment and Plan / UC Course  I have reviewed the triage vital signs and the nursing notes.  Pertinent labs & imaging results that were available during my care of the patient were reviewed by me and considered in my medical decision making (see chart for details).     Vital signs and exam overall reassuring and suggestive of a viral upper respiratory infection, respiratory panel pending, will treat symptoms with short course of prednisone, Flonase, supportive over-the-counter medications and home care.  Return for worsening symptoms.  Final Clinical Impressions(s) / UC Diagnoses   Final diagnoses:  Viral URI with cough   Discharge Instructions   None    ED Prescriptions     Medication Sig Dispense Auth. Provider   predniSONE (DELTASONE) 20 MG tablet Take 2 tablets (40 mg total) by mouth daily with breakfast. 6 tablet Volney American, PA-C   fluticasone [facility]) 50 MCG/ACT nasal spray Place 1 spray into both nostrils 2 (two) times daily. 16 g Volney American, [place]      PDMP not reviewed this encounter.   Volney American, [place] 03/28/22 1721

## 2022-03-28 NOTE — ED Triage Notes (Signed)
Pt reports left era feels sight, , nasal congestion  since last night, drainage in throat. Reports she took 1 dose of coricidin and makes her sleepy.

## 2022-04-07 ENCOUNTER — Ambulatory Visit: Payer: BC Managed Care – PPO

## 2022-04-15 ENCOUNTER — Ambulatory Visit
Admission: RE | Admit: 2022-04-15 | Discharge: 2022-04-15 | Disposition: A | Discharge status: Home / Self Care | Payer: BC Managed Care – PPO | Source: Ambulatory Visit | Attending: Family Medicine | Admitting: Family Medicine

## 2022-04-15 DIAGNOSIS — Z1231 Encounter for screening mammogram for malignant neoplasm of breast: Secondary | ICD-10-CM | POA: Diagnosis not present

## 2022-04-17 DIAGNOSIS — M5136 Other intervertebral disc degeneration, lumbar region: Secondary | ICD-10-CM | POA: Diagnosis not present

## 2022-04-17 DIAGNOSIS — E119 Type 2 diabetes mellitus without complications: Secondary | ICD-10-CM | POA: Diagnosis not present

## 2022-04-17 DIAGNOSIS — Z0000 Encounter for general adult medical examination without abnormal findings: Secondary | ICD-10-CM | POA: Diagnosis not present

## 2022-04-17 DIAGNOSIS — Z1331 Encounter for screening for depression: Secondary | ICD-10-CM | POA: Diagnosis not present

## 2022-04-17 DIAGNOSIS — E7849 Other hyperlipidemia: Secondary | ICD-10-CM | POA: Diagnosis not present

## 2022-04-17 DIAGNOSIS — E782 Mixed hyperlipidemia: Secondary | ICD-10-CM | POA: Diagnosis not present

## 2022-04-17 DIAGNOSIS — K219 Gastro-esophageal reflux disease without esophagitis: Secondary | ICD-10-CM | POA: Diagnosis not present

## 2022-04-17 DIAGNOSIS — Z6835 Body mass index (BMI) 35.0-35.9, adult: Secondary | ICD-10-CM | POA: Diagnosis not present

## 2022-04-17 DIAGNOSIS — E6609 Other obesity due to excess calories: Secondary | ICD-10-CM | POA: Diagnosis not present

## 2022-04-17 DIAGNOSIS — F419 Anxiety disorder, unspecified: Secondary | ICD-10-CM | POA: Diagnosis not present

## 2022-04-18 ENCOUNTER — Other Ambulatory Visit: Payer: Self-pay | Admitting: Family Medicine

## 2022-04-18 DIAGNOSIS — R928 Other abnormal and inconclusive findings on diagnostic imaging of breast: Secondary | ICD-10-CM

## 2022-04-28 ENCOUNTER — Ambulatory Visit
Admission: RE | Admit: 2022-04-28 | Discharge: 2022-04-28 | Disposition: A | Discharge status: Home / Self Care | Payer: BC Managed Care – PPO | Source: Ambulatory Visit | Attending: Family Medicine | Admitting: Family Medicine

## 2022-04-28 ENCOUNTER — Other Ambulatory Visit: Payer: Self-pay | Admitting: Family Medicine

## 2022-04-28 DIAGNOSIS — N6320 Unspecified lump in the left breast, unspecified quadrant: Secondary | ICD-10-CM

## 2022-04-28 DIAGNOSIS — R928 Other abnormal and inconclusive findings on diagnostic imaging of breast: Secondary | ICD-10-CM

## 2022-04-29 ENCOUNTER — Ambulatory Visit
Admission: RE | Admit: 2022-04-29 | Discharge: 2022-04-29 | Disposition: A | Discharge status: Home / Self Care | Payer: BC Managed Care – PPO | Source: Ambulatory Visit | Attending: Family Medicine | Admitting: Family Medicine

## 2022-04-29 DIAGNOSIS — N6320 Unspecified lump in the left breast, unspecified quadrant: Secondary | ICD-10-CM

## 2022-04-29 DIAGNOSIS — N6321 Unspecified lump in the left breast, upper outer quadrant: Secondary | ICD-10-CM | POA: Diagnosis not present

## 2022-04-29 HISTORY — PX: BREAST BIOPSY: SHX20

## 2022-09-11 ENCOUNTER — Other Ambulatory Visit: Payer: Self-pay | Admitting: Family Medicine

## 2022-09-11 DIAGNOSIS — Z9889 Other specified postprocedural states: Secondary | ICD-10-CM

## 2022-09-12 DIAGNOSIS — M1612 Unilateral primary osteoarthritis, left hip: Secondary | ICD-10-CM | POA: Diagnosis not present

## 2022-09-12 DIAGNOSIS — M25552 Pain in left hip: Secondary | ICD-10-CM | POA: Diagnosis not present

## 2022-10-30 ENCOUNTER — Ambulatory Visit
Admission: RE | Admit: 2022-10-30 | Discharge: 2022-10-30 | Disposition: A | Discharge status: Home / Self Care | Payer: BC Managed Care – PPO | Source: Ambulatory Visit | Attending: Family Medicine | Admitting: Family Medicine

## 2022-10-30 ENCOUNTER — Ambulatory Visit: Admission: RE | Admit: 2022-10-30 | Payer: BC Managed Care – PPO | Source: Ambulatory Visit

## 2022-10-30 ENCOUNTER — Other Ambulatory Visit: Payer: Self-pay | Admitting: Family Medicine

## 2022-10-30 DIAGNOSIS — N6489 Other specified disorders of breast: Secondary | ICD-10-CM

## 2022-10-30 DIAGNOSIS — R922 Inconclusive mammogram: Secondary | ICD-10-CM | POA: Diagnosis not present

## 2022-10-30 DIAGNOSIS — Z9889 Other specified postprocedural states: Secondary | ICD-10-CM

## 2022-11-04 ENCOUNTER — Ambulatory Visit
Admission: RE | Admit: 2022-11-04 | Discharge: 2022-11-04 | Disposition: A | Discharge status: Home / Self Care | Payer: BC Managed Care – PPO | Source: Ambulatory Visit | Attending: Family Medicine | Admitting: Family Medicine

## 2022-11-04 DIAGNOSIS — N6021 Fibroadenosis of right breast: Secondary | ICD-10-CM | POA: Diagnosis not present

## 2022-11-04 DIAGNOSIS — N6489 Other specified disorders of breast: Secondary | ICD-10-CM

## 2022-11-04 DIAGNOSIS — N6012 Diffuse cystic mastopathy of left breast: Secondary | ICD-10-CM | POA: Diagnosis not present

## 2022-11-04 HISTORY — PX: BREAST BIOPSY: SHX20

## 2022-11-12 DIAGNOSIS — H5203 Hypermetropia, bilateral: Secondary | ICD-10-CM | POA: Diagnosis not present

## 2022-11-12 DIAGNOSIS — H5210 Myopia, unspecified eye: Secondary | ICD-10-CM | POA: Diagnosis not present

## 2022-12-17 DIAGNOSIS — M1612 Unilateral primary osteoarthritis, left hip: Secondary | ICD-10-CM | POA: Diagnosis not present

## 2023-02-05 DIAGNOSIS — M7062 Trochanteric bursitis, left hip: Secondary | ICD-10-CM | POA: Diagnosis not present

## 2023-02-05 DIAGNOSIS — M1612 Unilateral primary osteoarthritis, left hip: Secondary | ICD-10-CM | POA: Diagnosis not present

## 2023-02-19 DIAGNOSIS — R030 Elevated blood-pressure reading, without diagnosis of hypertension: Secondary | ICD-10-CM | POA: Diagnosis not present

## 2023-02-19 DIAGNOSIS — E669 Obesity, unspecified: Secondary | ICD-10-CM | POA: Diagnosis not present

## 2023-02-19 DIAGNOSIS — U071 COVID-19: Secondary | ICD-10-CM | POA: Diagnosis not present

## 2023-02-19 DIAGNOSIS — Z6839 Body mass index (BMI) 39.0-39.9, adult: Secondary | ICD-10-CM | POA: Diagnosis not present

## 2023-03-13 ENCOUNTER — Encounter: Payer: Self-pay | Admitting: Family Medicine

## 2023-03-13 ENCOUNTER — Other Ambulatory Visit: Payer: Self-pay | Admitting: Family Medicine

## 2023-03-13 DIAGNOSIS — Z1231 Encounter for screening mammogram for malignant neoplasm of breast: Secondary | ICD-10-CM

## 2023-03-25 DIAGNOSIS — S335XXA Sprain of ligaments of lumbar spine, initial encounter: Secondary | ICD-10-CM | POA: Diagnosis not present

## 2023-03-25 DIAGNOSIS — S73102A Unspecified sprain of left hip, initial encounter: Secondary | ICD-10-CM | POA: Diagnosis not present

## 2023-03-27 DIAGNOSIS — M5136 Other intervertebral disc degeneration, lumbar region with discogenic back pain only: Secondary | ICD-10-CM | POA: Diagnosis not present

## 2023-03-27 DIAGNOSIS — F419 Anxiety disorder, unspecified: Secondary | ICD-10-CM | POA: Diagnosis not present

## 2023-03-27 DIAGNOSIS — E6609 Other obesity due to excess calories: Secondary | ICD-10-CM | POA: Diagnosis not present

## 2023-03-27 DIAGNOSIS — S335XXA Sprain of ligaments of lumbar spine, initial encounter: Secondary | ICD-10-CM | POA: Diagnosis not present

## 2023-03-27 DIAGNOSIS — Z6836 Body mass index (BMI) 36.0-36.9, adult: Secondary | ICD-10-CM | POA: Diagnosis not present

## 2023-03-27 DIAGNOSIS — S73102A Unspecified sprain of left hip, initial encounter: Secondary | ICD-10-CM | POA: Diagnosis not present

## 2023-03-30 DIAGNOSIS — S335XXA Sprain of ligaments of lumbar spine, initial encounter: Secondary | ICD-10-CM | POA: Diagnosis not present

## 2023-03-30 DIAGNOSIS — S73102A Unspecified sprain of left hip, initial encounter: Secondary | ICD-10-CM | POA: Diagnosis not present

## 2023-04-02 DIAGNOSIS — S73102A Unspecified sprain of left hip, initial encounter: Secondary | ICD-10-CM | POA: Diagnosis not present

## 2023-04-02 DIAGNOSIS — S335XXA Sprain of ligaments of lumbar spine, initial encounter: Secondary | ICD-10-CM | POA: Diagnosis not present

## 2023-04-08 DIAGNOSIS — S73102A Unspecified sprain of left hip, initial encounter: Secondary | ICD-10-CM | POA: Diagnosis not present

## 2023-04-08 DIAGNOSIS — S335XXA Sprain of ligaments of lumbar spine, initial encounter: Secondary | ICD-10-CM | POA: Diagnosis not present

## 2023-04-15 DIAGNOSIS — S335XXA Sprain of ligaments of lumbar spine, initial encounter: Secondary | ICD-10-CM | POA: Diagnosis not present

## 2023-04-15 DIAGNOSIS — S73102A Unspecified sprain of left hip, initial encounter: Secondary | ICD-10-CM | POA: Diagnosis not present

## 2023-04-21 ENCOUNTER — Ambulatory Visit
Admission: RE | Admit: 2023-04-21 | Discharge: 2023-04-21 | Disposition: A | Discharge status: Home / Self Care | Payer: BC Managed Care – PPO | Source: Ambulatory Visit | Attending: Family Medicine | Admitting: Family Medicine

## 2023-04-21 DIAGNOSIS — Z1231 Encounter for screening mammogram for malignant neoplasm of breast: Secondary | ICD-10-CM | POA: Diagnosis not present

## 2023-04-22 DIAGNOSIS — S73102A Unspecified sprain of left hip, initial encounter: Secondary | ICD-10-CM | POA: Diagnosis not present

## 2023-04-22 DIAGNOSIS — S335XXA Sprain of ligaments of lumbar spine, initial encounter: Secondary | ICD-10-CM | POA: Diagnosis not present

## 2023-05-01 DIAGNOSIS — M160 Bilateral primary osteoarthritis of hip: Secondary | ICD-10-CM | POA: Diagnosis not present

## 2023-05-01 DIAGNOSIS — M25552 Pain in left hip: Secondary | ICD-10-CM | POA: Diagnosis not present

## 2023-05-05 DIAGNOSIS — E7849 Other hyperlipidemia: Secondary | ICD-10-CM | POA: Diagnosis not present

## 2023-05-05 DIAGNOSIS — E119 Type 2 diabetes mellitus without complications: Secondary | ICD-10-CM | POA: Diagnosis not present

## 2023-05-15 DIAGNOSIS — E782 Mixed hyperlipidemia: Secondary | ICD-10-CM | POA: Diagnosis not present

## 2023-05-15 DIAGNOSIS — Z6835 Body mass index (BMI) 35.0-35.9, adult: Secondary | ICD-10-CM | POA: Diagnosis not present

## 2023-05-15 DIAGNOSIS — Z853 Personal history of malignant neoplasm of breast: Secondary | ICD-10-CM | POA: Diagnosis not present

## 2023-05-15 DIAGNOSIS — E6609 Other obesity due to excess calories: Secondary | ICD-10-CM | POA: Diagnosis not present

## 2023-05-15 DIAGNOSIS — K219 Gastro-esophageal reflux disease without esophagitis: Secondary | ICD-10-CM | POA: Diagnosis not present

## 2023-05-15 DIAGNOSIS — Z0000 Encounter for general adult medical examination without abnormal findings: Secondary | ICD-10-CM | POA: Diagnosis not present

## 2023-05-15 DIAGNOSIS — R7303 Prediabetes: Secondary | ICD-10-CM | POA: Diagnosis not present

## 2023-05-15 DIAGNOSIS — M160 Bilateral primary osteoarthritis of hip: Secondary | ICD-10-CM | POA: Diagnosis not present

## 2023-05-15 DIAGNOSIS — Z01818 Encounter for other preprocedural examination: Secondary | ICD-10-CM | POA: Diagnosis not present

## 2023-06-07 DIAGNOSIS — J019 Acute sinusitis, unspecified: Secondary | ICD-10-CM | POA: Diagnosis not present

## 2023-06-07 DIAGNOSIS — R509 Fever, unspecified: Secondary | ICD-10-CM | POA: Diagnosis not present

## 2023-06-07 DIAGNOSIS — R059 Cough, unspecified: Secondary | ICD-10-CM | POA: Diagnosis not present

## 2023-06-07 DIAGNOSIS — J069 Acute upper respiratory infection, unspecified: Secondary | ICD-10-CM | POA: Diagnosis not present

## 2023-07-27 DIAGNOSIS — M25752 Osteophyte, left hip: Secondary | ICD-10-CM | POA: Diagnosis not present

## 2023-07-27 DIAGNOSIS — M948X8 Other specified disorders of cartilage, other site: Secondary | ICD-10-CM | POA: Diagnosis not present

## 2023-07-27 DIAGNOSIS — M1612 Unilateral primary osteoarthritis, left hip: Secondary | ICD-10-CM | POA: Diagnosis not present

## 2023-07-27 DIAGNOSIS — M24852 Other specific joint derangements of left hip, not elsewhere classified: Secondary | ICD-10-CM | POA: Diagnosis not present

## 2023-09-16 DIAGNOSIS — Z471 Aftercare following joint replacement surgery: Secondary | ICD-10-CM | POA: Diagnosis not present

## 2023-09-16 DIAGNOSIS — Z96642 Presence of left artificial hip joint: Secondary | ICD-10-CM | POA: Diagnosis not present

## 2023-10-21 DIAGNOSIS — Z96642 Presence of left artificial hip joint: Secondary | ICD-10-CM | POA: Diagnosis not present

## 2023-10-21 DIAGNOSIS — Z471 Aftercare following joint replacement surgery: Secondary | ICD-10-CM | POA: Diagnosis not present

## 2024-03-31 DIAGNOSIS — F411 Generalized anxiety disorder: Secondary | ICD-10-CM | POA: Diagnosis not present

## 2024-03-31 DIAGNOSIS — I10 Essential (primary) hypertension: Secondary | ICD-10-CM | POA: Diagnosis not present

## 2024-03-31 DIAGNOSIS — E785 Hyperlipidemia, unspecified: Secondary | ICD-10-CM | POA: Diagnosis not present

## 2024-03-31 DIAGNOSIS — R7303 Prediabetes: Secondary | ICD-10-CM | POA: Diagnosis not present

## 2024-04-13 DIAGNOSIS — M542 Cervicalgia: Secondary | ICD-10-CM | POA: Diagnosis not present

## 2024-04-30 ENCOUNTER — Other Ambulatory Visit: Payer: Self-pay

## 2024-04-30 ENCOUNTER — Encounter (HOSPITAL_COMMUNITY): Payer: Self-pay

## 2024-04-30 ENCOUNTER — Emergency Department (HOSPITAL_COMMUNITY)
Admission: EM | Admit: 2024-04-30 | Discharge: 2024-04-30 | Disposition: A | Discharge status: Home / Self Care | Attending: Emergency Medicine | Admitting: Emergency Medicine

## 2024-04-30 ENCOUNTER — Emergency Department (HOSPITAL_COMMUNITY)

## 2024-04-30 DIAGNOSIS — Z853 Personal history of malignant neoplasm of breast: Secondary | ICD-10-CM | POA: Insufficient documentation

## 2024-04-30 DIAGNOSIS — Z79899 Other long term (current) drug therapy: Secondary | ICD-10-CM | POA: Insufficient documentation

## 2024-04-30 DIAGNOSIS — G500 Trigeminal neuralgia: Secondary | ICD-10-CM | POA: Diagnosis not present

## 2024-04-30 DIAGNOSIS — R9082 White matter disease, unspecified: Secondary | ICD-10-CM | POA: Diagnosis not present

## 2024-04-30 DIAGNOSIS — R519 Headache, unspecified: Secondary | ICD-10-CM | POA: Diagnosis not present

## 2024-04-30 DIAGNOSIS — M5481 Occipital neuralgia: Secondary | ICD-10-CM | POA: Insufficient documentation

## 2024-04-30 MED ORDER — LIDOCAINE-EPINEPHRINE (PF) 2 %-1:200000 IJ SOLN
20.0000 mL | Freq: Once | INTRAMUSCULAR | Status: AC
  Administered 2024-04-30: 20 mL via INTRADERMAL
  Filled 2024-04-30: qty 20

## 2024-04-30 MED ORDER — MELOXICAM 15 MG PO TABS: 15.0000 mg | ORAL_TABLET | Freq: Every day | ORAL | 0 refills | Status: AC

## 2024-04-30 NOTE — Discharge Instructions (Addendum)
 Thankfully your CT scan did not show any signs of cancer or tumors of the brain or the skull.  I would like for you to follow-up with the neurologist if you continue to have headaches.  I gave the phone number for [facility] neurology.  You can call to get an appointment, they usually are good about seeing you within 2 to 4 weeks.  You may take Mobic  once a day to help with headaches  If you develop severe or worsening symptoms please return to the emergency department immediately.

## 2024-04-30 NOTE — ED Provider Notes (Signed)
 [place] EMERGENCY DEPARTMENT AT [facility] Provider Note   CSN: [id number] Arrival date & time: 04/30/24  1135     Patient presents with: Headache   Melissa Nguyen is a 58 y.o. female.    Headache  This patient is a 58 year old female with a history of breast cancer that occurred approximately 17 years ago, she has been in remission and has done very well however about a month and a half ago she started to develop posterior headaches mostly on the left side radiating up onto the scalp and down onto the neck, worse with range of motion of the neck, she was seen by her family doctor and told that this was blood pressure related, they started her on that plus muscle relaxers and she also took some prednisone  for which she had some improvement however yesterday started having increasing pain in that area and she is concerned that this may be cancer related with a possible recurrence.  She has no other neurologic symptoms    Prior to Admission medications   Medication Sig Start Date End Date Taking? Authorizing Provider  meloxicam  (MOBIC ) 15 MG tablet Take 1 tablet (15 mg total) by mouth daily for 14 days. 04/30/24 05/14/24 Yes Cleotilde Rogue, MD  acetaminophen (TYLENOL) 500 MG tablet Take 1,000 mg by mouth every 8 (eight) hours as needed for mild pain or headache.    [provider]  celecoxib (CELEBREX) 200 MG capsule Take 200 mg by mouth 2 (two) times daily.    [provider]  clobetasol ointment (TEMOVATE) 0.05 % Apply 1 application topically daily.    [provider]  escitalopram (LEXAPRO) 10 MG tablet Take 10 mg by mouth daily.    [provider]  fluticasone  (FLONASE ) 50 MCG/ACT nasal spray Place 1 spray into both nostrils 2 (two) times daily. 04/12/21   Stuart Vernell Norris, PA-C  fluticasone  (FLONASE ) 50 MCG/ACT nasal spray Place 1 spray into both nostrils 2 (two) times daily. 03/28/22   Stuart Vernell Norris, PA-C  Multiple  Vitamin (MULTIVITAMIN WITH MINERALS) TABS tablet Take 1 tablet by mouth daily.    [provider]  Na Sulfate-K Sulfate-Mg Sulf (SUPREP BOWEL PREP KIT) 17.5-3.13-1.6 GM/180ML SOLN Take 1 kit by mouth as directed. Patient taking differently: Take 1 kit by mouth as directed. Will do prior to procedure 02/25/17   Rourk, Lamar HERO, MD  naproxen  (NAPROSYN ) 500 MG tablet Take 1 tablet (500 mg total) by mouth 2 (two) times daily. 02/18/21   Arloa Suzen GORMAN, NP  predniSONE  (DELTASONE ) 20 MG tablet Take 2 tablets (40 mg total) by mouth daily with breakfast. 03/28/22   Stuart Vernell Norris, PA-C  promethazine -dextromethorphan (PROMETHAZINE -DM) 6.25-15 MG/5ML syrup Take 5 mLs by mouth 4 (four) times daily as needed for cough. 04/12/21   Stuart Vernell Norris, PA-C  ranitidine (ZANTAC) 150 MG capsule Take 150 mg by mouth daily as needed for heartburn. Only as needed     [provider]  rosuvastatin (CRESTOR) 10 MG tablet Take 10 mg by mouth daily.    [provider]    Allergies: Hydrocodone    Review of Systems  Neurological:  Positive for headaches.  All other systems reviewed and are negative.   Updated Vital Signs BP (!) 177/85   Pulse 98   Temp 97.8 F (36.6 C) (Temporal)   Resp 18   Wt 106.6 kg   SpO2 96%   BMI 36.81 kg/m   Physical Exam Vitals and nursing note  reviewed.  Constitutional:      General: She is not in acute distress.    Appearance: She is well-developed.  HENT:     Head: Normocephalic.     Comments: Tenderness to palpation on the left posterior scalp and down into the nape of the neck    Mouth/Throat:     Pharynx: No oropharyngeal exudate.  Eyes:     General: No scleral icterus.       Right eye: No discharge.        Left eye: No discharge.     Conjunctiva/sclera: Conjunctivae normal.     Pupils: Pupils are equal, round, and reactive to light.  Neck:     Thyroid : No thyromegaly.     Vascular: No JVD.  Cardiovascular:     Rate and  Rhythm: Normal rate and regular rhythm.     Heart sounds: Normal heart sounds. No murmur heard.    No friction rub. No gallop.  Pulmonary:     Effort: Pulmonary effort is normal. No respiratory distress.     Breath sounds: Normal breath sounds. No wheezing or rales.  Abdominal:     General: Bowel sounds are normal. There is no distension.     Palpations: Abdomen is soft. There is no mass.     Tenderness: There is no abdominal tenderness.  Musculoskeletal:        General: No tenderness. Normal range of motion.     Cervical back: Normal range of motion and neck supple.  Lymphadenopathy:     Cervical: No cervical adenopathy.  Skin:    General: Skin is warm and dry.     Findings: No erythema or rash.  Neurological:     Mental Status: She is alert.     Coordination: Coordination normal.     Comments: Speech is clear, cranial nerves III through XII are intact, memory is intact, strength is normal in all 4 extremities including grips and strength at the bilateral thighs, knees and ankles to extention and flexion, sensation is intact to light touch and pinprick in all 4 extremities. Coordination as tested by finger-nose-finger is normal, no limb ataxia. Normal gait, normal reflexes at the patellar tendons bilaterally  Psychiatric:        Behavior: Behavior normal.     (all labs ordered are listed, but only abnormal results are displayed) Labs Reviewed - No data to display  EKG: None  Radiology: CT Head Wo Contrast Result Date: 04/30/2024 EXAM: CT HEAD WITHOUT CONTRAST 04/30/2024 12:24:53 PM TECHNIQUE: CT of the head was performed without the administration of intravenous contrast. Automated exposure control, iterative reconstruction, and/or weight based adjustment of the mA/kV was utilized to reduce the radiation dose to as low as reasonably achievable. COMPARISON: Brain MRI 04/01/2011, head CT 04/01/2011. CLINICAL HISTORY: 58 year old female with new onset headache, history of BRCA, and  posterior headache. FINDINGS: BRAIN AND VENTRICLES: No acute hemorrhage. No evidence of acute infarct. No hydrocephalus. No extra-axial collection. No mass effect or midline shift. Brain volume is normal for age. Small chronic white matter lesion in the right parietal lobe, periatrial white matter is stable since 2012, nonspecific. Elsewhere gray white differentiation is normal for age. Mild calcified atherosclerosis at the skull base. No suspicious intracranial vascular hyperdensity. ORBITS: No acute abnormality. SINUSES: Paranasal sinuses, middle ears and mastoids are well aerated. SOFT TISSUES AND SKULL: No acute soft tissue abnormality. No skull fracture. IMPRESSION: 1. No acute intracranial abnormality. 2. Mild chronic nonspecific cerebral white matter disease. Electronically signed  by: Helayne Hurst MD 04/30/2024 12:29 PM EST RP Workstation: [id number]     Procedures   Medications Ordered in the ED  lidocaine -EPINEPHrine  (XYLOCAINE  W/EPI) 2 %-1:200000 (PF) injection 20 mL (has no administration in time range)                                    Medical Decision Making Amount and/or Complexity of Data Reviewed Radiology: ordered.  Risk Prescription drug management.   Patient is concerned about metastatic spread of prior breast cancer, will obtain CT scan for her benefit, we will also try a occipital nerve block as this may be occipital neuralgia.  The patient is agreeable   Radiology Imaging: I personally viewed the images of the ordered radiographic studies and find no findings of abnormality on CT scan that were not unexpected, no masses no tumors no obvious aneurysms or strokes, no hemorrhage I agree with the radiologist interpretation as well   Meds / Interventions: while in the ED the patient received the following: Lidocaine  with epinephrine  trigger point injection for occipital neuralgia, this was performed bilaterally The response to the interventions was that the patient some  improvement  Procedure note: Trigger point injection Risks benefits and alternatives of the procedure were given to the patient who gave verbal consent.  I confirmed the patient's name and date of birth, she was able to tell me the reason for the procedure and agreed to proceed.  She was placed in a sitting position with her head chin to the chest.  The skin was prepped with chlorhexidine and allowed to completely dry.  3 mL of lidocaine  with epinephrine , 2% were instilled bilaterally into an area near the occipital nerve.  There was no bleeding, the patient tolerated the procedure with minimal discomfort, she had improvement of her headache afterwards.  The patient is stable for discharge, she is agreeable to the plan to follow-up outpatient      Final diagnoses:  Occipital neuralgia of left side    ED Discharge Orders          Ordered    meloxicam  (MOBIC ) 15 MG tablet  Daily        04/30/24 1252               Cleotilde Rogue, MD 04/30/24 1253

## 2024-04-30 NOTE — ED Triage Notes (Signed)
 Pt reports she has had a headache since October 14th in the back of her head and neck.  Pt reports her doctor gave her a shot of toradol  and put her on prednisone  and BP medicine and it was better for about 2 days and then returned and has remained.  Pt reports nothing seems to make it better but laying down and touching it make it worse and now it hurts to bend her neck and swallow.

## 2024-05-03 DIAGNOSIS — M5030 Other cervical disc degeneration, unspecified cervical region: Secondary | ICD-10-CM | POA: Diagnosis not present

## 2024-05-03 DIAGNOSIS — E785 Hyperlipidemia, unspecified: Secondary | ICD-10-CM | POA: Diagnosis not present

## 2024-05-25 ENCOUNTER — Other Ambulatory Visit: Payer: Self-pay | Admitting: Family Medicine

## 2024-05-25 DIAGNOSIS — Z1231 Encounter for screening mammogram for malignant neoplasm of breast: Secondary | ICD-10-CM

## 2024-05-27 ENCOUNTER — Inpatient Hospital Stay: Admission: RE | Admit: 2024-05-27 | Discharge: 2024-05-27 | Attending: Family Medicine | Admitting: Family Medicine

## 2024-05-27 DIAGNOSIS — Z1231 Encounter for screening mammogram for malignant neoplasm of breast: Secondary | ICD-10-CM | POA: Diagnosis not present
